# Patient Record
Sex: Female | Born: 1970 | Race: White | Hispanic: Yes | Marital: Single | Smoking: Never smoker
Health system: Southern US, Community
[De-identification: ages and names within clinical notes are randomized; demographics above are authoritative.]

## PROBLEM LIST (undated history)

## (undated) DIAGNOSIS — K8020 Calculus of gallbladder without cholecystitis without obstruction: Secondary | ICD-10-CM

## (undated) DIAGNOSIS — K219 Gastro-esophageal reflux disease without esophagitis: Secondary | ICD-10-CM

## (undated) DIAGNOSIS — K760 Fatty (change of) liver, not elsewhere classified: Secondary | ICD-10-CM

## (undated) DIAGNOSIS — M1990 Unspecified osteoarthritis, unspecified site: Secondary | ICD-10-CM

## (undated) DIAGNOSIS — I10 Essential (primary) hypertension: Secondary | ICD-10-CM

## (undated) HISTORY — PX: ABDOMINAL HYSTERECTOMY: SHX81

## (undated) HISTORY — DX: Calculus of gallbladder without cholecystitis without obstruction: K80.20

## (undated) HISTORY — PX: TUBAL LIGATION: SHX77

## (undated) HISTORY — DX: Fatty (change of) liver, not elsewhere classified: K76.0

---

## 2001-10-26 ENCOUNTER — Other Ambulatory Visit: Admission: RE | Admit: 2001-10-26 | Discharge: 2001-10-26 | Payer: Self-pay | Admitting: *Deleted

## 2002-03-31 ENCOUNTER — Inpatient Hospital Stay (HOSPITAL_COMMUNITY): Admission: AD | Admit: 2002-03-31 | Discharge: 2002-03-31 | Payer: Self-pay | Admitting: Obstetrics and Gynecology

## 2002-03-31 ENCOUNTER — Encounter: Payer: Self-pay | Admitting: Obstetrics and Gynecology

## 2002-04-03 ENCOUNTER — Inpatient Hospital Stay (HOSPITAL_COMMUNITY): Admission: AD | Admit: 2002-04-03 | Discharge: 2002-04-03 | Payer: Self-pay | Admitting: Obstetrics and Gynecology

## 2002-04-03 ENCOUNTER — Encounter: Payer: Self-pay | Admitting: Obstetrics and Gynecology

## 2002-04-15 ENCOUNTER — Inpatient Hospital Stay (HOSPITAL_COMMUNITY): Admission: AD | Admit: 2002-04-15 | Discharge: 2002-04-17 | Payer: Self-pay | Admitting: *Deleted

## 2002-04-26 ENCOUNTER — Inpatient Hospital Stay (HOSPITAL_COMMUNITY): Admission: AD | Admit: 2002-04-26 | Discharge: 2002-04-26 | Payer: Self-pay | Admitting: Obstetrics and Gynecology

## 2003-12-28 ENCOUNTER — Other Ambulatory Visit: Admission: RE | Admit: 2003-12-28 | Discharge: 2003-12-28 | Payer: Self-pay | Admitting: Gynecology

## 2004-12-29 ENCOUNTER — Other Ambulatory Visit: Admission: RE | Admit: 2004-12-29 | Discharge: 2004-12-29 | Payer: Self-pay | Admitting: Gynecology

## 2006-01-12 ENCOUNTER — Other Ambulatory Visit: Admission: RE | Admit: 2006-01-12 | Discharge: 2006-01-12 | Payer: Self-pay | Admitting: Gynecology

## 2006-03-10 ENCOUNTER — Ambulatory Visit (HOSPITAL_COMMUNITY): Admission: RE | Admit: 2006-03-10 | Discharge: 2006-03-10 | Payer: Self-pay | Admitting: Gynecology

## 2007-02-14 ENCOUNTER — Ambulatory Visit (HOSPITAL_COMMUNITY): Admission: RE | Admit: 2007-02-14 | Discharge: 2007-02-14 | Payer: Self-pay | Admitting: Chiropractic Medicine

## 2007-07-29 ENCOUNTER — Other Ambulatory Visit: Admission: RE | Admit: 2007-07-29 | Discharge: 2007-07-29 | Payer: Self-pay | Admitting: Gynecology

## 2010-11-09 ENCOUNTER — Encounter: Payer: Self-pay | Admitting: Gynecology

## 2011-07-17 ENCOUNTER — Ambulatory Visit
Admission: RE | Admit: 2011-07-17 | Discharge: 2011-07-17 | Disposition: A | Discharge status: Home / Self Care | Payer: BC Managed Care – PPO | Source: Ambulatory Visit | Attending: Specialist | Admitting: Specialist

## 2011-07-17 ENCOUNTER — Other Ambulatory Visit: Payer: Self-pay | Admitting: Specialist

## 2011-07-17 DIAGNOSIS — M542 Cervicalgia: Secondary | ICD-10-CM

## 2011-07-17 DIAGNOSIS — M25511 Pain in right shoulder: Secondary | ICD-10-CM

## 2011-07-17 DIAGNOSIS — R200 Anesthesia of skin: Secondary | ICD-10-CM

## 2012-03-07 ENCOUNTER — Other Ambulatory Visit: Payer: Self-pay | Admitting: Radiology

## 2013-11-04 ENCOUNTER — Ambulatory Visit (INDEPENDENT_AMBULATORY_CARE_PROVIDER_SITE_OTHER): Payer: BC Managed Care – PPO | Admitting: Emergency Medicine

## 2013-11-04 DIAGNOSIS — A088 Other specified intestinal infections: Secondary | ICD-10-CM

## 2013-11-04 MED ORDER — LOPERAMIDE HCL 2 MG PO TABS: ORAL_TABLET | ORAL | Status: DC

## 2013-11-04 MED ORDER — ONDANSETRON 8 MG PO TBDP: 8.0000 mg | ORAL_TABLET | Freq: Three times a day (TID) | ORAL | Status: DC | PRN

## 2013-11-04 MED ORDER — ONDANSETRON 4 MG PO TBDP
4.0000 mg | ORAL_TABLET | Freq: Once | ORAL | Status: AC
  Administered 2013-11-04: 4 mg via ORAL

## 2013-11-04 NOTE — Patient Instructions (Signed)
Gastroenteritis viral °(Viral Gastroenteritis) °La gastroenteritis viral también es conocida como gripe del estómago. Este trastorno afecta el estómago y el tubo digestivo. Puede causar diarrea y vómitos repentinos. La enfermedad generalmente dura entre 3 y 8 días. La mayoría de las personas desarrolla una respuesta inmunológica. Con el tiempo, esto elimina el virus. Mientras se desarrolla esta respuesta natural, el virus puede afectar en forma importante su salud.  °CAUSAS °Muchos virus diferentes pueden causar gastroenteritis, por ejemplo el rotavirus o el norovirus. Estos virus pueden contagiarse al consumir alimentos o agua contaminados. También puede contagiarse al compartir utensilios u otros artículos personales con una persona infectada o al tocar una superficie contaminada.  °SÍNTOMAS °Los síntomas más comunes son diarrea y vómitos. Estos problemas pueden causar una pérdida grave de líquidos corporales(deshidratación) y un desequilibrio de sales corporales(electrolitos). Otros síntomas pueden ser:  °· Fiebre. °· Dolor de cabeza. °· Fatiga. °· Dolor abdominal. °DIAGNÓSTICO  °El médico podrá hacer el diagnóstico de gastroenteritis viral basándose en los síntomas y el examen físico También pueden tomarle una muestra de materia fecal para diagnosticar la presencia de virus u otras infecciones.  °TRATAMIENTO °Esta enfermedad generalmente desaparece sin tratamiento. Los tratamientos están dirigidos a la rehidratación. Los casos más graves de gastroenteritis viral implican vómitos tan intensos que no es posible retener líquidos. En estos casos, los líquidos deben administrarse a través de una vía intravenosa (IV).  °INSTRUCCIONES PARA EL CUIDADO DOMICILIARIO °· Beba suficientes líquidos para mantener la orina clara o de color amarillo pálido. Beba pequeñas cantidades de líquido con frecuencia y aumente la cantidad según la tolerancia. °· Pida instrucciones específicas a su médico con respecto a la  rehidratación. °· Evite: °¨ Alimentos que tengan mucha azúcar. °¨ Alcohol. °¨ Gaseosas. °¨ Tabaco. °¨ Jugos. °¨ Bebidas con cafeína. °¨ Líquidos muy calientes o fríos. °¨ Alimentos muy grasos. °¨ Comer demasiado a la vez. °¨ Productos lácteos hasta 24 a 48 horas después de que se detenga la diarrea. °· Puede consumir probióticos. Los probióticos son cultivos activos de bacterias beneficiosas. Pueden disminuir la cantidad y el número de deposiciones diarreicas en el adulto. Se encuentran en los yogures con cultivos activos y en los suplementos. °· Lave bien sus manos para evitar que se disemine el virus. °· Sólo tome medicamentos de venta libre o recetados para calmar el dolor, las molestias o bajar la fiebre según las indicaciones de su médico. No administre aspirina a los niños. Los medicamentos antidiarreicos no son recomendables. °· Consulte a su médico si puede seguir tomando sus medicamentos recetados o de venta libre. °· Cumpla con todas las visitas de control, según le indique su médico. °SOLICITE ATENCIÓN MÉDICA DE INMEDIATO SI: °· No puede retener líquidos. °· No hay emisión de orina durante 6 a 8 horas. °· Le falta el aire. °· Observa sangre en el vómito (se ve como café molido) o en la materia fecal. °· Siente dolor abdominal que empeora o se concentra en una zona pequeña (se localiza). °· Tiene náuseas o vómitos persistentes. °· Tiene fiebre. °· El paciente es un niño menor de 3 meses y tiene fiebre. °· El paciente es un niño mayor de 3 meses, tiene fiebre y síntomas persistentes. °· El paciente es un niño mayor de 3 meses y tiene fiebre y síntomas que empeoran repentinamente. °· El paciente es un bebé y no tiene lágrimas cuando llora. °ASEGÚRESE QUE:  °· Comprende estas instrucciones. °· Controlará su enfermedad. °· Solicitará ayuda inmediatamente si no mejora o si empeora. °Document Released: 10/05/2005   Document Revised: 12/28/2011 [facility] Patient Information 2014 [place], [place]. Dieta de lquidos  claros (Clear Liquid Diet) La dieta de lquidos claros consiste en alimentos lquidos o que se transforman en lquidos a la Engineer, water. Algunos ejemplos de lquidos claros que se incluyen en la dieta son los jugos de frutas, caldo o consom, gelatina o helados de [place]. Debe poder ver a travs del lquido. El propsito de seguir esta dieta es proporcionar lquidos, electrolitos como sodio y potasio y la energa para Contractor organismo en funcionamiento durante los momentos en que no puede seguir una dieta normal. Una dieta de lquidos claros no debe continuarse durante largos perodos ya que no es adecuada desde el punto de vista nutricional.  UNA DIETA DE LQUIDOS CLAROS PUEDE SER NECESARIA:  Cuando aparece una enfermedad repentina (aguda) antes o despus de [place].   Como Software engineer paso de la alimentacin por va oral.   Para la reposicin de lquidos y electrolitos en la diarrea.   Se puede utilizar como una dieta antes de Optometrist ciertos estudios.  REQUERIMIENTOS La dieta de lquidos claros cumple los requerimientos slo para el cido ascrbico, segn los Recommended Dietary Allowances (cantidades recomendadas en la dieta) [place] ([place]).  [place] y harinas  Permitidos: Ninguno   Evitar: Todos deben evitarse.  Verduras  Permitidas: Jugos de vegetales colados.   Evite: Todas los dems.  Frutas  Permitidas: Los jugos de frutas colados y bebidas preparadas con fruta. Incluye 1 porcin de ctricos o jugos de frutas enriquecidos con [place].   Evite: Todas las dems  Carnes y sustitutos  Permitidos: Ninguno.   Evitar: Todas deben evitarse.  Productos lcteos  Permitidos: Ninguno.   Evitar: Todos debe evitarse.  Sopas y alimentos combinados  Permitidos: Caldos claros o procesados, sopas a base de caldos.   Evite: [employer].  Postres y  dulces  Permitidos:  Location manager, miel. Gelatina rica en protenas. Gelatina saborizada, sorbetes o popsicles que no contengan leche   Evite: [employer].  Grasas y aceites  Permitidos: Ninguno.   Evitar: Todos deben evitarse.  Bebidas  Permitidas Bebidas elaboradas con cereal, caf (comn o descafeinado), t o soda, segn el criterio de su mdico.   Evite: Todas las dems.  Condimentos  Permitidos: Sal.   Evite: [employer], inclusive [place].  Suplementos  Permitidos: Bebidas lquidas nutritivas por las que se puede ver a travs de ellas.   Evite:  Cualquiera que contenga lactosa o [place]. EJEMPLO DE PLAN DE ALIMENTACIN Desayuno  4 onzas (120 ml) de jugo de naranjas exprimidas.   a 1 taza (120 a 240 ml) de gelatina (comn o fortificada).  1 taza (240 ml) de infusin (caf o te).  Azcar, si lo desea. Colacin a media maana   taza (120 ml) de gelatina (comn o fortificada). Almuerzo:  1 taza (240 ml) de caldo o consom.  4 onzas (120 ml) de jugo de pomelos exprimidos.   taza (120 ml) de gelatina (comn o fortificada).  1 taza (240 ml) de infusin (caf o te).  Azcar, si lo desea. Colacin de media tarde   taza (120 ml) de helado de frutas.   taza (120 ml) de jugo de frutas exprimidas. Cena.  1 taza (240 ml) de caldo o consom.   taza (120 ml) de jugo de arndanos.   taza (120 ml) de gelatina saborizada (comn o fortificada).  1 taza (240 ml)  de infusin (caf o t).  Azcar, si lo desea. Colacin de la noche.  4 onzas (120 ml) de jugo de manzanas (fortificado con vitamina C).   taza (120 ml) de gelatina saborizada (comn o fortificada). ASEGRESE DE QUE:  Comprende estas instrucciones.  Controlar la enfermedad del nio.  Solicitar ayuda de inmediato si el nio no mejora o si empeora. Document Released: 10/05/2005 Document Revised: 06/07/2013 [facility] Patient Information 2014 [place], [place].

## 2013-11-04 NOTE — Progress Notes (Signed)
[facility] [address], [place] [address] [phone]  Date:  11/04/2013   Name:  Brittany Romero   DOB:  [birth year]   MRN:  [id number]  PCP:  No primary provider on file.    Chief Complaint: Shoulder Pain, Chest Pain, Emesis and Diarrhea   History of Present Illness:  Brittany Romero is a 43 y.o. very pleasant female patient who presents with the following:  Ill three days with nausea and vomiting.  Later developed diarrhea 8-10 times daily watery in nature.  No fever but chilled.  No rash, cough or coryza.  No travel or sketchy water.  Still nauseated and vomited today.  Poor po intake.  No improvement with over the counter medications or other home remedies. Denies other complaint or health concern today.   There are no active problems to display for this patient.   No past medical history on file.  No past surgical history on file.  History  Substance Use Topics  . Smoking status: Not on file  . Smokeless tobacco: Not on file  . Alcohol Use: No    No family history on file.  No Known Allergies  Medication list has been reviewed and updated.  No current outpatient prescriptions on file prior to visit.   No current facility-administered medications on file prior to visit.    Review of Systems:  As per HPI, otherwise negative.    Physical Examination: There were no vitals filed for this visit. There were no vitals filed for this visit. There is no height or weight on file to calculate BMI. Ideal Body Weight:    GEN: WDWN, NAD, Non-toxic, A & O x 3 HEENT: Atraumatic, Normocephalic. Neck supple. No masses, No LAD. Ears and Nose: No external deformity. CV: RRR, No M/G/R. No JVD. No thrill. No extra heart sounds. PULM: CTA B, no wheezes, crackles, rhonchi. No retractions. No resp. distress. No accessory muscle use. ABD: S, NT, ND, +BS. No rebound. No HSM. EXTR: No c/c/e NEURO Normal gait.  PSYCH: Normally interactive. Conversant.  Not depressed or anxious appearing.  Calm demeanor.    Assessment and Plan: Gastroenteritis zofran Imodium Clears   Signed,  Ellison Carwin, MD

## 2014-01-06 ENCOUNTER — Ambulatory Visit (INDEPENDENT_AMBULATORY_CARE_PROVIDER_SITE_OTHER): Payer: BC Managed Care – PPO | Admitting: Family Medicine

## 2014-01-06 VITALS — BP 102/72 | HR 82 | Temp 98.2°F | Resp 16 | Ht 64.0 in | Wt 161.8 lb

## 2014-01-06 DIAGNOSIS — I10 Essential (primary) hypertension: Secondary | ICD-10-CM

## 2014-01-06 DIAGNOSIS — M791 Myalgia, unspecified site: Secondary | ICD-10-CM

## 2014-01-06 DIAGNOSIS — IMO0001 Reserved for inherently not codable concepts without codable children: Secondary | ICD-10-CM

## 2014-01-06 MED ORDER — LISINOPRIL-HYDROCHLOROTHIAZIDE 10-12.5 MG PO TABS: 1.0000 | ORAL_TABLET | Freq: Every day | ORAL | Status: DC

## 2014-01-06 MED ORDER — PREDNISONE 20 MG PO TABS: 40.0000 mg | ORAL_TABLET | Freq: Every day | ORAL | Status: DC

## 2014-01-06 NOTE — Patient Instructions (Signed)
Brittany Romero

## 2014-01-06 NOTE — Progress Notes (Signed)
This chart was scribed for Robyn Haber, MD by Vernell Barrier, Medical Scribe. This patient's care was started at 3:45 PM  Patient ID: Brittany Romero MRN: [id number], DOB: [birth year], 43 y.o. Date of Encounter: 01/06/2014, 3:44 PM  Primary Physician: No primary provider on file.  Chief Complaint: bilateral shoulder pain  HPI: 43 y.o. year old female with history below presents with bilateral shoulder pain, onset 1 month ago. Pt states she cannot lift her arms without pain and it hurts to hyperextend her neck. Has also had 3 episodes of left sided chest pain that usually follow after eating. Pt has already been to a physical therapist with no relief of pain. Pt works in a [employer] that involves a lot of lifting; has been working there for 12 years.    History reviewed. No pertinent past medical history.   Home Meds: Prior to Admission medications   Medication Sig Start Date End Date Taking? Authorizing Provider  loperamide (IMODIUM A-D) 2 MG tablet 2 now and one after each loose stool as often as q1h.  Max 8 in 24 h 11/04/13   Ellison Carwin, MD  ondansetron (ZOFRAN-ODT) 8 MG disintegrating tablet Take 1 tablet (8 mg total) by mouth every 8 (eight) hours as needed for nausea. 11/04/13   Ellison Carwin, MD    Allergies: No Known Allergies  History   Social History   Marital Status: Single    Spouse Name: N/A    Number of Children: N/A   Years of Education: N/A   Occupational History   Not on file.   Social History Main Topics   Smoking status: Not on file   Smokeless tobacco: Not on file   Alcohol Use: No   Drug Use: No   Sexual Activity: Not on file   Other Topics Concern   Not on file   Social History Narrative   No narrative on file     Review of Systems: Constitutional: negative for chills, fever, night sweats, weight changes, or fatigue  HEENT: negative for vision changes, hearing loss, congestion, rhinorrhea, ST, epistaxis, or sinus  pressure Cardiovascular: negative for palpitations. Positive for chest pain. Respiratory: negative for hemoptysis, wheezing, shortness of breath, or cough Abdominal: negative for abdominal pain, nausea, vomiting, diarrhea, or constipation Dermatological: negative for rash Neurologic: negative for headache, dizziness, or syncope Musc: Positive for shoulder pain All other systems reviewed and are otherwise negative with the exception to those above and in the HPI.   Physical Exam: Blood pressure 102/72, pulse 82, temperature 98.2 F (36.8 C), temperature source Oral, resp. rate 16, height 5\' 4"  (1.626 m), weight 161 lb 12.8 oz (73.392 kg), last menstrual period 06/23/2011, SpO2 99.00%., Body mass index is 27.76 kg/(m^2). General: Well developed, well nourished, in no acute distress. Head: Normocephalic, atraumatic, eyes without discharge, sclera non-icteric, nares are without discharge. Bilateral auditory canals clear, TM's are without perforation, pearly grey and translucent with reflective cone of light bilaterally. Oral cavity moist, posterior pharynx without exudate, erythema, peritonsillar abscess, or post nasal drip.  Neck: Supple. No thyromegaly. Full ROM. No lymphadenopathy. Lungs: Clear bilaterally to auscultation without wheezes, rales, or rhonchi. Breathing is unlabored. Heart: RRR with S1 S2. No murmurs, rubs, or gallops appreciated. Abdomen: Soft, non-tender, non-distended with normoactive bowel sounds. No hepatomegaly. No rebound/guarding. No obvious abdominal masses. Msk:  Strength and tone normal for age. Extremities/Skin: Warm and dry. No clubbing or cyanosis. No edema. No rashes or suspicious lesions. Neuro: Alert and oriented X 3. Moves  all extremities spontaneously. Gait is normal. CNII-XII grossly in tact. Psych:  Responds to questions appropriately with a normal affect.     ASSESSMENT AND PLAN:  43 y.o. year old female with Myalgia - Plan: predniSONE (DELTASONE) 20 MG  tablet, Comprehensive metabolic panel  Hypertension - Plan: lisinopril-hydrochlorothiazide (PRINZIDE,ZESTORETIC) 10-12.5 MG per tablet, Comprehensive metabolic panel  Recheck in 3 days   Signed, Robyn Haber, MD 01/06/2014 3:44 PM

## 2014-01-07 LAB — COMPREHENSIVE METABOLIC PANEL
ALT: 25 U/L (ref 0–35)
AST: 17 U/L (ref 0–37)
Albumin: 4.6 g/dL (ref 3.5–5.2)
Alkaline Phosphatase: 83 U/L (ref 39–117)
BUN: 16 mg/dL (ref 6–23)
CO2: 27 mEq/L (ref 19–32)
Calcium: 9.7 mg/dL (ref 8.4–10.5)
Chloride: 100 mEq/L (ref 96–112)
Creat: 0.49 mg/dL — ABNORMAL LOW (ref 0.50–1.10)
Glucose, Bld: 114 mg/dL — ABNORMAL HIGH (ref 70–99)
Potassium: 3.7 mEq/L (ref 3.5–5.3)
Sodium: 137 mEq/L (ref 135–145)
Total Bilirubin: 0.4 mg/dL (ref 0.2–1.2)
Total Protein: 7.7 g/dL (ref 6.0–8.3)

## 2014-01-09 ENCOUNTER — Ambulatory Visit: Payer: BC Managed Care – PPO

## 2014-01-09 ENCOUNTER — Ambulatory Visit (INDEPENDENT_AMBULATORY_CARE_PROVIDER_SITE_OTHER): Payer: BC Managed Care – PPO | Admitting: Family Medicine

## 2014-01-09 VITALS — BP 126/82 | HR 98 | Temp 98.2°F | Resp 17 | Ht 63.5 in | Wt 160.0 lb

## 2014-01-09 DIAGNOSIS — M542 Cervicalgia: Secondary | ICD-10-CM

## 2014-01-09 MED ORDER — METHYLPREDNISOLONE 4 MG PO TABS: ORAL_TABLET | ORAL | Status: DC

## 2014-01-09 NOTE — Progress Notes (Signed)
° °  Subjective:  This chart was scribed for Robyn Haber, MD by Donato Schultz, Medical Scribe. This patient was seen in Room 11 and the patient's care was started at 6:37 PM.   Patient ID: Brittany Romero, female    DOB: 19-Mar-1971, 43 y.o.   MRN: [id number]  HPI HPI Comments: Brittany Romero is a 43 y.o. female who presents to the [facility] complaining of constant neck pain radiating to her right shoulder and chest.  The patient states that the pain in her right shoulder and chest have resolved but she is still experiencing neck pain and headache.  She states that she has been going to work and can lift heavy things while at work.     No past medical history on file. Past Surgical History  Procedure Laterality Date   Abdominal hysterectomy     No family history on file. History   Social History   Marital Status: Single    Spouse Name: N/A    Number of Children: N/A   Years of Education: N/A   Occupational History   Not on file.   Social History Main Topics   Smoking status: Never Smoker    Smokeless tobacco: Not on file   Alcohol Use: No   Drug Use: No   Sexual Activity: Not on file   Other Topics Concern   Not on file   Social History Narrative   No narrative on file   No Known Allergies  Review of Systems  Musculoskeletal: Positive for neck pain. Negative for arthralgias.  Neurological: Positive for headaches.  All other systems reviewed and are negative.     Objective:  Physical Exam Skin: Ecchymosis where her straps go over her shoulder.   Neurological: Normal bicep and tricep reflexes. [facility] reading (PRIMARY) by  Dr. Joseph Art:  C/spine:  Normal AP and lateral.    BP 126/82   Pulse 98   Temp(Src) 98.2 F (36.8 C) (Oral)   Resp 17   Ht 5' 3.5" (1.613 m)   Wt 160 lb (72.576 kg)   BMI 27.89 kg/m2   SpO2 98%   LMP 06/23/2011 Assessment & Plan:  I personally performed the services described in this documentation, which was  scribed in my presence. The recorded information has been reviewed and is accurate.  Neck pain - Plan: DG Cervical Spine 2 or 3 views, methylPREDNISolone (MEDROL) 4 MG tablet  Signed, Robyn Haber, MD

## 2014-01-15 ENCOUNTER — Ambulatory Visit: Payer: BC Managed Care – PPO

## 2014-01-15 ENCOUNTER — Ambulatory Visit (INDEPENDENT_AMBULATORY_CARE_PROVIDER_SITE_OTHER): Payer: BC Managed Care – PPO | Admitting: Family Medicine

## 2014-01-15 VITALS — BP 114/78 | HR 77 | Temp 98.1°F | Resp 16 | Ht 63.5 in | Wt 158.0 lb

## 2014-01-15 DIAGNOSIS — M542 Cervicalgia: Secondary | ICD-10-CM

## 2014-01-15 DIAGNOSIS — M25512 Pain in left shoulder: Secondary | ICD-10-CM

## 2014-01-15 DIAGNOSIS — R42 Dizziness and giddiness: Secondary | ICD-10-CM

## 2014-01-15 DIAGNOSIS — M25519 Pain in unspecified shoulder: Secondary | ICD-10-CM

## 2014-01-15 LAB — POCT CBC
Granulocyte percent: 54.6 %G (ref 37–80)
HCT, POC: 46.4 % (ref 37.7–47.9)
Hemoglobin: 15.1 g/dL (ref 12.2–16.2)
Lymph, poc: 4.1 — AB (ref 0.6–3.4)
MCH, POC: 29.2 pg (ref 27–31.2)
MCHC: 32.5 g/dL (ref 31.8–35.4)
MCV: 89.5 fL (ref 80–97)
MID (cbc): 1 — AB (ref 0–0.9)
MPV: 9.4 fL (ref 0–99.8)
POC Granulocyte: 6.2 (ref 2–6.9)
POC LYMPH PERCENT: 36.3 %L (ref 10–50)
POC MID %: 9.1 %M (ref 0–12)
Platelet Count, POC: 267 10*3/uL (ref 142–424)
RBC: 5.18 M/uL (ref 4.04–5.48)
RDW, POC: 14.4 %
WBC: 11.3 10*3/uL — AB (ref 4.6–10.2)

## 2014-01-15 LAB — GLUCOSE, POCT (MANUAL RESULT ENTRY): POC Glucose: 67 mg/dl — AB (ref 70–99)

## 2014-01-15 NOTE — Progress Notes (Signed)
HPI: 43 y.o. year old female with history below presents with bilateral shoulder pain, onset 1 month ago. Pt states she cannot lift her arms without pain and it hurts to hyperextend her neck. Has also had 3 episodes of left sided chest pain that usually follow after eating. Pt has already been to a physical therapist with no relief of pain. Pt works in a [employer] that involves a lot of lifting; has been working there for 12 years.   She's been able to continue work although she does not have to lift heavy items at this point.  She says that she does have some pain in her left neck as well as over the scapular region and where the bra strap courses over the shoulder, as well as over the lateral aspect of the shoulder.  In addition she's been dizzy lately with lightheadedness when she gets up quickly. She's worried about having diabetes or cholesterol problems.  Objective:  NAD  [facility] reading (PRIMARY) by  Dr. Joseph Art negative shoulder film  Results for orders placed in visit on 01/15/14  POCT CBC      Result Value Ref Range   WBC 11.3 (*) 4.6 - 10.2 K/uL   Lymph, poc 4.1 (*) 0.6 - 3.4   POC LYMPH PERCENT 36.3  10 - 50 %L   MID (cbc) 1.0 (*) 0 - 0.9   POC MID % 9.1  0 - 12 %M   POC Granulocyte 6.2  2 - 6.9   Granulocyte percent 54.6  37 - 80 %G   RBC 5.18  4.04 - 5.48 M/uL   Hemoglobin 15.1  12.2 - 16.2 g/dL   HCT, POC 46.4  37.7 - 47.9 %   MCV 89.5  80 - 97 fL   MCH, POC 29.2  27 - 31.2 pg   MCHC 32.5  31.8 - 35.4 g/dL   RDW, POC 14.4     Platelet Count, POC 267  142 - 424 K/uL   MPV 9.4  0 - 99.8 fL  GLUCOSE, POCT (MANUAL RESULT ENTRY)      Result Value Ref Range   POC Glucose 67 (*) 70 - 99 mg/dl   Examination of the left shoulder reveals diffuse mild tenderness on palpating the anterior and superior joint line of the left shoulder as well as some tenderness over the deltoid region and in the left lower cervical paraspinal region. There is no swelling or bony abnormality,  patient has full range of motion..  Assessment: Persistent strain of the left shoulder and neck. I think the best course of action at this point is to get a physical therapist involved and have patient come back in 2 weeks.  .Left shoulder pain - Plan: DG Shoulder Left  Dizziness - Plan: POCT CBC, POCT glucose (manual entry), Lipid panel, Comprehensive metabolic panel    Signed, Stephannie Li M.D.

## 2014-01-16 LAB — LIPID PANEL
Cholesterol: 205 mg/dL — ABNORMAL HIGH (ref 0–200)
HDL: 54 mg/dL (ref >39)
LDL Cholesterol: 109 mg/dL — ABNORMAL HIGH (ref 0–99)
Total CHOL/HDL Ratio: 3.8 Ratio
Triglycerides: 209 mg/dL — ABNORMAL HIGH (ref <150)
VLDL: 42 mg/dL — ABNORMAL HIGH (ref 0–40)

## 2014-01-16 LAB — COMPREHENSIVE METABOLIC PANEL
ALT: 52 U/L — ABNORMAL HIGH (ref 0–35)
AST: 15 U/L (ref 0–37)
Albumin: 4.8 g/dL (ref 3.5–5.2)
Alkaline Phosphatase: 92 U/L (ref 39–117)
BUN: 12 mg/dL (ref 6–23)
CO2: 30 mEq/L (ref 19–32)
Calcium: 9.7 mg/dL (ref 8.4–10.5)
Chloride: 97 mEq/L (ref 96–112)
Creat: 0.51 mg/dL (ref 0.50–1.10)
Glucose, Bld: 66 mg/dL — ABNORMAL LOW (ref 70–99)
Potassium: 3.6 mEq/L (ref 3.5–5.3)
Sodium: 138 mEq/L (ref 135–145)
Total Bilirubin: 0.6 mg/dL (ref 0.2–1.2)
Total Protein: 8.1 g/dL (ref 6.0–8.3)

## 2014-10-24 ENCOUNTER — Emergency Department (HOSPITAL_COMMUNITY): Payer: BLUE CROSS/BLUE SHIELD

## 2014-10-24 ENCOUNTER — Encounter (HOSPITAL_COMMUNITY): Payer: Self-pay | Admitting: Adult Health

## 2014-10-24 ENCOUNTER — Emergency Department (HOSPITAL_COMMUNITY)
Admission: EM | Admit: 2014-10-24 | Discharge: 2014-10-24 | Disposition: A | Discharge status: Home / Self Care | Payer: BLUE CROSS/BLUE SHIELD | Attending: Emergency Medicine | Admitting: Emergency Medicine

## 2014-10-24 DIAGNOSIS — M542 Cervicalgia: Secondary | ICD-10-CM | POA: Diagnosis not present

## 2014-10-24 DIAGNOSIS — M79641 Pain in right hand: Secondary | ICD-10-CM | POA: Diagnosis not present

## 2014-10-24 DIAGNOSIS — G43109 Migraine with aura, not intractable, without status migrainosus: Secondary | ICD-10-CM | POA: Insufficient documentation

## 2014-10-24 DIAGNOSIS — M79643 Pain in unspecified hand: Secondary | ICD-10-CM

## 2014-10-24 DIAGNOSIS — R51 Headache: Secondary | ICD-10-CM

## 2014-10-24 DIAGNOSIS — R519 Headache, unspecified: Secondary | ICD-10-CM

## 2014-10-24 MED ORDER — SODIUM CHLORIDE 0.9 % IV BOLUS (SEPSIS)
1000.0000 mL | Freq: Once | INTRAVENOUS | Status: AC
  Administered 2014-10-24: 1000 mL via INTRAVENOUS

## 2014-10-24 MED ORDER — DIPHENHYDRAMINE HCL 50 MG/ML IJ SOLN
25.0000 mg | Freq: Once | INTRAMUSCULAR | Status: AC
  Administered 2014-10-24: 25 mg via INTRAVENOUS
  Filled 2014-10-24: qty 1

## 2014-10-24 MED ORDER — KETOROLAC TROMETHAMINE 30 MG/ML IJ SOLN
30.0000 mg | Freq: Once | INTRAMUSCULAR | Status: AC
  Administered 2014-10-24: 30 mg via INTRAVENOUS
  Filled 2014-10-24: qty 1

## 2014-10-24 MED ORDER — PROCHLORPERAZINE EDISYLATE 5 MG/ML IJ SOLN
10.0000 mg | Freq: Four times a day (QID) | INTRAMUSCULAR | Status: DC | PRN
  Administered 2014-10-24: 10 mg via INTRAVENOUS
  Filled 2014-10-24: qty 2

## 2014-10-24 NOTE — ED Provider Notes (Signed)
CSN: [id number]     Arrival date & time 10/24/14  1605 History   None    Chief Complaint  Patient presents with  . Headache     (Consider location/radiation/quality/duration/timing/severity/associated sxs/prior Treatment) Patient is a 44 y.o. female presenting with headaches.  Headache Pain location:  Generalized Radiates to:  L neck Onset quality:  Gradual Duration:  4 hours Timing:  Intermittent Progression:  Waxing and waning Chronicity:  Recurrent Similar to prior headaches: yes   Context: bright light   Worsened by:  Light Associated symptoms: dizziness, nausea, neck pain, numbness (tingling/altered sensation right arm and leg), paresthesias, tingling and vomiting   Associated symptoms: no abdominal pain, no back pain, no cough, no diarrhea, no fever, no focal weakness, no sore throat, no syncope, no visual change and no weakness     History reviewed. No pertinent past medical history. Past Surgical History  Procedure Laterality Date  . Abdominal hysterectomy     History reviewed. No pertinent family history. History  Substance Use Topics  . Smoking status: Never Smoker   . Smokeless tobacco: Not on file  . Alcohol Use: No   OB History    No data available     Review of Systems  Constitutional: Negative for fever.  HENT: Negative for sore throat.   Eyes: Negative for visual disturbance.  Respiratory: Negative for cough and shortness of breath.   Cardiovascular: Negative for chest pain and syncope.  Gastrointestinal: Positive for nausea and vomiting. Negative for abdominal pain, diarrhea and constipation.  Genitourinary: Negative for difficulty urinating.  Musculoskeletal: Positive for neck pain. Negative for back pain.  Skin: Negative for rash.  Neurological: Positive for dizziness, numbness (tingling/altered sensation right arm and leg), headaches and paresthesias. Negative for focal weakness, syncope, facial asymmetry, speech difficulty and weakness.       Allergies  Review of patient's allergies indicates no known allergies.  Home Medications   Prior to Admission medications   Medication Sig Start Date End Date Taking? Authorizing Provider  lisinopril-hydrochlorothiazide (PRINZIDE,ZESTORETIC) 10-12.5 MG per tablet Take 1 tablet by mouth daily. 01/06/14   Robyn Haber, MD  loperamide (IMODIUM A-D) 2 MG tablet 2 now and one after each loose stool as often as q1h.  Max 8 in 24 h 11/04/13   Roselee Culver, MD  methylPREDNISolone (MEDROL) 4 MG tablet 2 tablets al dia 01/09/14   Robyn Haber, MD  ondansetron (ZOFRAN-ODT) 8 MG disintegrating tablet Take 1 tablet (8 mg total) by mouth every 8 (eight) hours as needed for nausea. 11/04/13   Roselee Culver, MD  predniSONE (DELTASONE) 20 MG tablet Take 2 tablets (40 mg total) by mouth daily. 01/06/14   Robyn Haber, MD   BP 136/90 mmHg  Pulse 98  Temp(Src) 98.2 F (36.8 C) (Oral)  Resp 15  SpO2 100%  LMP 06/23/2011 Physical Exam  Constitutional: She is oriented to person, place, and time. She appears well-developed and well-nourished. No distress.  HENT:  Head: Normocephalic and atraumatic.  Eyes: Conjunctivae and EOM are normal.  Neck: Normal range of motion.  Cardiovascular: Normal rate, regular rhythm, normal heart sounds and intact distal pulses.  Exam reveals no gallop and no friction rub.   No murmur heard. Pulmonary/Chest: Effort normal and breath sounds normal. No respiratory distress. She has no wheezes. She has no rales.  Abdominal: Soft. She exhibits no distension. There is no tenderness. There is no guarding.  Musculoskeletal: She exhibits no edema.       Cervical back:  She exhibits tenderness (bilateral traps).       Right hand: She exhibits tenderness (area of swelling over dorsum of right hand, no erytherma). She exhibits normal capillary refill. Normal sensation noted. Normal strength noted.  Neurological: She is alert and oriented to person, place, and time.  She has normal strength. No cranial nerve deficit. Sensory deficit: initially altered sensation on right, on repeat exam no altered sensation. Coordination and gait normal. GCS eye subscore is 4. GCS verbal subscore is 5. GCS motor subscore is 6.  Skin: Skin is warm and dry. No rash noted. She is not diaphoretic. No erythema.  Nursing note and vitals reviewed.   ED Course  Procedures (including critical care time) Labs Review Labs Reviewed - No data to display  Imaging Review No results found.   EKG Interpretation None      MDM   Final diagnoses:  None   44 year old Spanish-speaking female with history of neck pain presents with concern of headache and right sided numbness.  She has has a history of headaches and reports one similar headache in the past. Headache had a slow onset and have low suspicion for subarachnoid hemorrhage.  No fever to indicate meningitis. She does not have any stroke risk factors and other than altered sensation on the right her neurologic exam is normal.  Headache with several characteristics consistent with complicated migraine including photophobia/nausea.  CT Head within normal limits.  Patient given headache cocktail of compazine and benadryl with HA improved and neurologic symptoms resolved. Given additional toradol with HA nearly resolved. Given normal repeat neurologic exam, lack of stroke risk factors, low suspicion that HA and transient numbness represent ischemic stroke.  In addition, pt reports chronic episodes of neck pain, and notes area of swelling and pain over right hand.  Xr shows no sign of fracture.  Area not consistent with abscess or cellulitis.  Patient discharged in stable condition with understanding of reasons to return and will follow up closely with the PCP.     Alvino Chapel, MD 10/25/14 3435  Arbie Cookey, MD 10/27/14 [phone]

## 2014-10-24 NOTE — ED Notes (Signed)
Presents with gradual onset of headahce, became worse while at work this afternoon associated with nausea, pt is spanish speaking. Interpreter phones used. Warm to touch. This is the second headache like this she has had.  She reports blurred vision, dizziness and numbness to face and right arm weakness.

## 2014-10-25 NOTE — ED Provider Notes (Signed)
I saw and evaluated the patient, reviewed the resident's note and I agree with the findings and plan.   EKG Interpretation None       44 yo female presenting with chief complaint of headache with right-sided body tingling. She has had similar symptoms in the past. She denies right-sided weakness, but does have pain in her wrist.  On exam, well appearing, nontoxic, not distressed, normal respiratory effort, normal perfusion, grip strength is mildly decreased on the right secondary to pain in her wrist, otherwise treat him shows normal strength in all extremities, sensation normal. Imaging negative. Symptoms resolved after treatment of headache. Symptoms likely represent a complicated migraine. She has no stroke risk factors. Her symptoms were felt to be very unlikely represent CVA. After treatment, she appeared stable for discharge home.  Clinical Impression: 1. Complicated migraine   2. Headache   3. Hand pain       Houston Siren III, MD 10/27/14 [phone]

## 2016-02-15 DIAGNOSIS — M1990 Unspecified osteoarthritis, unspecified site: Secondary | ICD-10-CM | POA: Insufficient documentation

## 2016-02-15 DIAGNOSIS — E781 Pure hyperglyceridemia: Secondary | ICD-10-CM | POA: Insufficient documentation

## 2016-02-15 DIAGNOSIS — E785 Hyperlipidemia, unspecified: Secondary | ICD-10-CM | POA: Insufficient documentation

## 2016-02-15 DIAGNOSIS — I10 Essential (primary) hypertension: Secondary | ICD-10-CM | POA: Diagnosis present

## 2017-02-18 ENCOUNTER — Emergency Department (HOSPITAL_COMMUNITY): Payer: BLUE CROSS/BLUE SHIELD

## 2017-02-18 ENCOUNTER — Encounter (HOSPITAL_COMMUNITY): Payer: Self-pay

## 2017-02-18 ENCOUNTER — Observation Stay (HOSPITAL_COMMUNITY)
Admission: EM | Admit: 2017-02-18 | Discharge: 2017-02-19 | Disposition: A | Discharge status: Home / Self Care | Payer: BLUE CROSS/BLUE SHIELD | Attending: Internal Medicine | Admitting: Internal Medicine

## 2017-02-18 DIAGNOSIS — J101 Influenza due to other identified influenza virus with other respiratory manifestations: Secondary | ICD-10-CM | POA: Insufficient documentation

## 2017-02-18 DIAGNOSIS — I10 Essential (primary) hypertension: Secondary | ICD-10-CM | POA: Insufficient documentation

## 2017-02-18 DIAGNOSIS — K8020 Calculus of gallbladder without cholecystitis without obstruction: Principal | ICD-10-CM | POA: Insufficient documentation

## 2017-02-18 DIAGNOSIS — Z79899 Other long term (current) drug therapy: Secondary | ICD-10-CM | POA: Insufficient documentation

## 2017-02-18 DIAGNOSIS — R110 Nausea: Secondary | ICD-10-CM

## 2017-02-18 DIAGNOSIS — R1011 Right upper quadrant pain: Secondary | ICD-10-CM | POA: Diagnosis present

## 2017-02-18 DIAGNOSIS — R112 Nausea with vomiting, unspecified: Secondary | ICD-10-CM

## 2017-02-18 DIAGNOSIS — K219 Gastro-esophageal reflux disease without esophagitis: Secondary | ICD-10-CM | POA: Diagnosis present

## 2017-02-18 DIAGNOSIS — R059 Cough, unspecified: Secondary | ICD-10-CM | POA: Insufficient documentation

## 2017-02-18 HISTORY — DX: Essential (primary) hypertension: I10

## 2017-02-18 HISTORY — DX: Influenza due to other identified influenza virus with other respiratory manifestations: J10.1

## 2017-02-18 HISTORY — DX: Unspecified osteoarthritis, unspecified site: M19.90

## 2017-02-18 HISTORY — DX: Gastro-esophageal reflux disease without esophagitis: K21.9

## 2017-02-18 LAB — COMPREHENSIVE METABOLIC PANEL
ALK PHOS: 100 U/L (ref 38–126)
ALT: 28 U/L (ref 14–54)
AST: 27 U/L (ref 15–41)
Albumin: 4.6 g/dL (ref 3.5–5.0)
Anion gap: 10 (ref 5–15)
BUN: 11 mg/dL (ref 6–20)
CALCIUM: 9.1 mg/dL (ref 8.9–10.3)
CO2: 26 mmol/L (ref 22–32)
Chloride: 104 mmol/L (ref 101–111)
Creatinine, Ser: 0.55 mg/dL (ref 0.44–1.00)
GLUCOSE: 94 mg/dL (ref 65–99)
POTASSIUM: 3.5 mmol/L (ref 3.5–5.1)
Sodium: 140 mmol/L (ref 135–145)
TOTAL PROTEIN: 7.8 g/dL (ref 6.5–8.1)
Total Bilirubin: 0.5 mg/dL (ref 0.3–1.2)

## 2017-02-18 LAB — I-STAT CG4 LACTIC ACID, ED: LACTIC ACID, VENOUS: 0.58 mmol/L (ref 0.5–1.9)

## 2017-02-18 LAB — CBC
HCT: 39 % (ref 36.0–46.0)
Hemoglobin: 13.1 g/dL (ref 12.0–15.0)
MCH: 29 pg (ref 26.0–34.0)
MCHC: 33.6 g/dL (ref 30.0–36.0)
MCV: 86.5 fL (ref 78.0–100.0)
Platelets: 155 10*3/uL (ref 150–400)
RBC: 4.51 MIL/uL (ref 3.87–5.11)
RDW: 13.4 % (ref 11.5–15.5)
WBC: 7.2 10*3/uL (ref 4.0–10.5)

## 2017-02-18 LAB — URINALYSIS, ROUTINE W REFLEX MICROSCOPIC
Bilirubin Urine: NEGATIVE
Glucose, UA: NEGATIVE mg/dL
Hgb urine dipstick: NEGATIVE
KETONES UR: 20 mg/dL — AB
LEUKOCYTES UA: NEGATIVE
NITRITE: NEGATIVE
Protein, ur: NEGATIVE mg/dL
Specific Gravity, Urine: 1.011 (ref 1.005–1.030)
pH: 7 (ref 5.0–8.0)

## 2017-02-18 LAB — INFLUENZA PANEL BY PCR (TYPE A & B)
INFLBPCR: POSITIVE — AB
Influenza A By PCR: NEGATIVE

## 2017-02-18 LAB — I-STAT BETA HCG BLOOD, ED (MC, WL, AP ONLY): I-stat hCG, quantitative: <5 m[IU]/mL (ref <5)

## 2017-02-18 LAB — LIPASE, BLOOD: LIPASE: 28 U/L (ref 11–51)

## 2017-02-18 MED ORDER — ACETAMINOPHEN 325 MG PO TABS: 650.0000 mg | ORAL_TABLET | Freq: Four times a day (QID) | ORAL | Status: DC | PRN

## 2017-02-18 MED ORDER — SODIUM CHLORIDE 0.9 % IV BOLUS (SEPSIS)
1000.0000 mL | Freq: Once | INTRAVENOUS | Status: AC
  Administered 2017-02-18: 1000 mL via INTRAVENOUS

## 2017-02-18 MED ORDER — HYDROCODONE-ACETAMINOPHEN 5-325 MG PO TABS: 1.0000 | ORAL_TABLET | ORAL | Status: DC | PRN

## 2017-02-18 MED ORDER — ACETAMINOPHEN 500 MG PO TABS
1000.0000 mg | ORAL_TABLET | Freq: Once | ORAL | Status: AC
  Administered 2017-02-18: 1000 mg via ORAL
  Filled 2017-02-18: qty 2

## 2017-02-18 MED ORDER — ONDANSETRON HCL 4 MG/2ML IJ SOLN: 4.0000 mg | Freq: Four times a day (QID) | INTRAMUSCULAR | Status: DC | PRN

## 2017-02-18 MED ORDER — MORPHINE SULFATE (PF) 4 MG/ML IV SOLN
4.0000 mg | Freq: Once | INTRAVENOUS | Status: AC
  Administered 2017-02-18: 4 mg via INTRAVENOUS
  Filled 2017-02-18: qty 1

## 2017-02-18 MED ORDER — ONDANSETRON HCL 4 MG/2ML IJ SOLN
4.0000 mg | Freq: Once | INTRAMUSCULAR | Status: AC
  Administered 2017-02-18: 4 mg via INTRAVENOUS
  Filled 2017-02-18: qty 2

## 2017-02-18 MED ORDER — ACETAMINOPHEN 650 MG RE SUPP: 650.0000 mg | Freq: Four times a day (QID) | RECTAL | Status: DC | PRN

## 2017-02-18 MED ORDER — SODIUM CHLORIDE 0.9 % IV SOLN
INTRAVENOUS | Status: DC
  Administered 2017-02-18 – 2017-02-19 (×2): via INTRAVENOUS

## 2017-02-18 MED ORDER — CHLORHEXIDINE GLUCONATE 0.12 % MT SOLN
15.0000 mL | Freq: Two times a day (BID) | OROMUCOSAL | Status: DC
  Administered 2017-02-19: 15 mL via OROMUCOSAL
  Filled 2017-02-18: qty 15

## 2017-02-18 MED ORDER — ONDANSETRON HCL 4 MG PO TABS: 4.0000 mg | ORAL_TABLET | Freq: Four times a day (QID) | ORAL | Status: DC | PRN

## 2017-02-18 NOTE — ED Notes (Signed)
ULTRASOUND AT BEDSIDE

## 2017-02-18 NOTE — Consult Note (Signed)
Reason for Consult:abdominal pain Referring Physician: Little MD  Brittany Romero is an 46 y.o. female.  HPI: asked to see patient at the request of Dr.  Rex Kras for abdominal pain and gallstones. She has a 1 day history of abdomin She also gives a history of cough and has a fever to 102. Part of her workup of her abdomin She does have intermittent right  She also had some nausea and  Vomiting. She complains of cough and congestion. She has a runny nose.  Past Medical History:  Diagnosis Date  . Arthritis   . GERD (gastroesophageal reflux disease)   . Hypertension     Past Surgical History:  Procedure Laterality Date  . ABDOMINAL HYSTERECTOMY      No family history on file.  Social History:  reports that she has never smoked. She has never used smokeless tobacco. She reports that she does not drink alcohol or use drugs.  Allergies: No Known Allergies  Medications: I have reviewed the patient's current medications.  Results for orders placed or performed during the hospital encounter of 02/18/17 (from the past 48 hour(s))  Urinalysis, Routine w reflex microscopic     Status: Abnormal   Collection Time: 02/18/17  5:31 PM  Result Value Ref Range   Color, Urine YELLOW YELLOW   APPearance CLEAR CLEAR   Specific Gravity, Urine 1.011 1.005 - 1.030   pH 7.0 5.0 - 8.0   Glucose, UA NEGATIVE NEGATIVE mg/dL   Hgb urine dipstick NEGATIVE NEGATIVE   Bilirubin Urine NEGATIVE NEGATIVE   Ketones, ur 20 (A) NEGATIVE mg/dL   Protein, ur NEGATIVE NEGATIVE mg/dL   Nitrite NEGATIVE NEGATIVE   Leukocytes, UA NEGATIVE NEGATIVE  Lipase, blood     Status: None   Collection Time: 02/18/17  6:28 PM  Result Value Ref Range   Lipase 28 11 - 51 U/L  Comprehensive metabolic panel     Status: None   Collection Time: 02/18/17  6:28 PM  Result Value Ref Range   Sodium 140 135 - 145 mmol/L   Potassium 3.5 3.5 - 5.1 mmol/L   Chloride 104 101 - 111 mmol/L   CO2 26 22 - 32 mmol/L   Glucose, Bld 94 65 - 99  mg/dL   BUN 11 6 - 20 mg/dL   Creatinine, Ser 0.55 0.44 - 1.00 mg/dL   Calcium 9.1 8.9 - 10.3 mg/dL   Total Protein 7.8 6.5 - 8.1 g/dL   Albumin 4.6 3.5 - 5.0 g/dL   AST 27 15 - 41 U/L   ALT 28 14 - 54 U/L   Alkaline Phosphatase 100 38 - 126 U/L   Total Bilirubin 0.5 0.3 - 1.2 mg/dL   GFR calc non Af Amer >60 >60 mL/min   GFR calc Af Amer >60 >60 mL/min    Comment: (NOTE) The eGFR has been calculated using the CKD EPI equation. This calculation has not been validated in all clinical situations. eGFR's persistently <60 mL/min signify possible Chronic Kidney Disease.    Anion gap 10 5 - 15  CBC     Status: None   Collection Time: 02/18/17  6:28 PM  Result Value Ref Range   WBC 7.2 4.0 - 10.5 K/uL   RBC 4.51 3.87 - 5.11 MIL/uL   Hemoglobin 13.1 12.0 - 15.0 g/dL   HCT 39.0 36.0 - 46.0 %   MCV 86.5 78.0 - 100.0 fL   MCH 29.0 26.0 - 34.0 pg   MCHC 33.6 30.0 - 36.0 g/dL  RDW 13.4 11.5 - 15.5 %   Platelets 155 150 - 400 K/uL  I-Stat beta hCG blood, ED     Status: None   Collection Time: 02/18/17  6:48 PM  Result Value Ref Range   I-stat hCG, quantitative <5.0 <5 mIU/mL   Comment 3            Comment:   GEST. AGE      CONC.  (mIU/mL)   <=1 WEEK        5 - 50     2 WEEKS       50 - 500     3 WEEKS       100 - 10,000     4 WEEKS     1,000 - 30,000        FEMALE AND NON-PREGNANT FEMALE:     LESS THAN 5 mIU/mL   I-Stat CG4 Lactic Acid, ED     Status: None   Collection Time: 02/18/17  6:49 PM  Result Value Ref Range   Lactic Acid, Venous 0.58 0.5 - 1.9 mmol/L  Influenza panel by PCR (type A & B)     Status: Abnormal   Collection Time: 02/18/17  7:02 PM  Result Value Ref Range   Influenza A By PCR NEGATIVE NEGATIVE   Influenza B By PCR POSITIVE (A) NEGATIVE    Comment: (NOTE) The Xpert Xpress Flu assay is intended as an aid in the diagnosis of  influenza and should not be used as a sole basis for treatment.  This  assay is FDA approved for nasopharyngeal swab specimens only.  Nasal  washings and aspirates are unacceptable for Xpert Xpress Flu testing.     Dg Chest 2 View  Result Date: 02/18/2017 CLINICAL DATA:  Right upper quadrant abdominal pain. Nonproductive cough. Fever and chills. EXAM: CHEST  2 VIEW COMPARISON:  None. FINDINGS: Normal sized heart. Clear lungs. Minimal peribronchial thickening. Minimal thoracic spine degenerative changes. IMPRESSION: Minimal bronchitic changes. Electronically Signed   By: Claudie Revering M.D.   On: 02/18/2017 18:46   US Abdomen Limited Ruq  Result Date: 02/18/2017 CLINICAL DATA:  46 y/o  F; right upper quadrant pain with vomiting. EXAM: US ABDOMEN LIMITED - RIGHT UPPER QUADRANT COMPARISON:  None. FINDINGS: Gallbladder: No gallbladder wall thickening or pericholecystic fluid. Negative sonographic Murphy's sign. Echogenic foci are present within the gallbladder compatible with small stones. Common bile duct: Diameter: 5.3 mm Liver: Diffusely increased echogenicity.  No focal lesion identified. IMPRESSION: 1. Cholelithiasis.  No secondary signs of acute cholecystitis. 2. Hepatic steatosis. Electronically Signed   By: Kristine Garbe M.D.   On: 02/18/2017 19:18    Review of Systems  Constitutional: Positive for chills, fever and malaise/fatigue.  HENT: Negative for hearing loss and tinnitus.   Eyes: Negative for blurred vision and double vision.  Respiratory: Positive for cough, sputum production and wheezing.   Cardiovascular: Positive for chest pain and palpitations.  Gastrointestinal: Positive for abdominal pain.  Genitourinary: Negative for dysuria and urgency.  Musculoskeletal: Positive for myalgias.  Skin: Negative for itching and rash.  Neurological: Negative for dizziness.  Psychiatric/Behavioral: Positive for depression.   Blood pressure 127/87, pulse 93, temperature (!) 102 F (38.9 C), temperature source Oral, resp. rate 18, height _0  (1.626 m), weight 72.6 kg (160 lb), last menstrual period 06/23/2011, SpO2  94 %. Physical Exam  Constitutional: She is oriented to person, place, and time. She appears well-developed and well-nourished.  HENT:  Head: Normocephalic and atraumatic.  Nose: Rhinorrhea present.  Eyes: Pupils are equal, round, and reactive to light.  Cardiovascular: Normal rate and regular rhythm.   Respiratory: Effort normal. She has wheezes.  GI: There is tenderness in the right upper quadrant. There is no rigidity, no rebound and no guarding.  Musculoskeletal: Normal range of motion.  Neurological: She is alert and oriented to person, place, and time.  Skin: Skin is warm and dry.  Psychiatric: She has a normal mood and affect. Her behavior is normal. Judgment and thought content normal.    Assessment/Plan: Gallstones symptomatic colic    URI-   ASK MEDICINE TO SEE GIVEN FEVER. I DO NOT THINK THIS IS FROM HER GALLBLADDER.   Normal labs but will need gallbladder out at some point    Clear liquids ok until midnight.  NPO after midnight. Will reassess in am per [facility].     Evani Shrider A. 02/18/2017, 8:23 PM

## 2017-02-18 NOTE — ED Notes (Signed)
Patient transported to X-ray 

## 2017-02-18 NOTE — H&P (Signed)
History and Physical    Brittany Romero JIR:[id number] DOB: 05/28/1971 DOA: 02/18/2017  PCP: Cathleen Corti, PA-C   Patient coming from: Home  Chief Complaint: Nausea, vomiting, abdominal pain, cough, fever  HPI: Brittany Romero is a 46 y.o. woman (Spanish speaking) with a history of HTN, GERD, and OA who feels that she was in her baseline state of health until three days ago.  Patient speaks some English, but no as much as her daughters, who translate for her at bedside.  She has had fever (as high has 105), chills, sweats, and nasal congestion.  She has had a cough productive of clear sputum.  She has had clear nasal drainage.  She has had fatigue.  She has also had nausea and vomiting.  She has had intermittent RUQ abdominal pain, 10 out of 10 in intensity at its worst, exacerbated by eating.  It has not been relieved by Advil.  No known sick contacts.  ED Course: Tmax 102.  LA 0.58.  CBC, CMP essentially normal.  Lipase 28.  Chest xray shows minimal bronchitis changes; otherwise negative.  Abdominal ultrasound shows gallstones and hepatic steatosis.  Blood and urine cultures pending  General surgery called for cholelithiasis.  Hospitalist asked to admit.  Flu screen ultimately positive for influenza B.   Review of Systems: As per HPI otherwise 10 systems reviewed and negative.   Past Medical History:  Diagnosis Date  . Arthritis   . GERD (gastroesophageal reflux disease)   . Hypertension     Past Surgical History:  Procedure Laterality Date  . ABDOMINAL HYSTERECTOMY       reports that she has never smoked. She has never used smokeless tobacco. She reports that she does not drink alcohol or use drugs.  No Known Allergies  FAMILY HISTORY: One sister died of complications related to uterine cancer.  Prior to Admission medications   Not on File    Physical Exam: Vitals:   02/18/17 1728 02/18/17 1729 02/18/17 1914 02/18/17 2032  BP: 128/89  127/87 128/78  Pulse: 96  93  88  Resp: 18  18 16   Temp: (!) 100.8 F (38.2 C)  (!) 102 F (38.9 C) 99.2 F (37.3 C)  TempSrc: Oral  Oral Oral  SpO2: 100%  94% 99%  Weight:  72.6 kg (160 lb)    Height:  5\' 4"  (1.626 m)        Constitutional: NAD, calm, ill-appearing Vitals:   02/18/17 1728 02/18/17 1729 02/18/17 1914 02/18/17 2032  BP: 128/89  127/87 128/78  Pulse: 96  93 88  Resp: 18  18 16   Temp: (!) 100.8 F (38.2 C)  (!) 102 F (38.9 C) 99.2 F (37.3 C)  TempSrc: Oral  Oral Oral  SpO2: 100%  94% 99%  Weight:  72.6 kg (160 lb)    Height:  5\' 4"  (1.626 m)     Eyes: PERRL, lids and conjunctivae normal ENMT: Mucous membranes are dry.  Normal dentition.  Neck: normal appearance, supple, no masses Respiratory: clear to auscultation bilaterally, no wheezing, no crackles. Normal respiratory effort. No accessory muscle use.  Cardiovascular: Normal rate, regular rhythm, no murmurs / rubs / gallops. No extremity edema. 2+ pedal pulses. GI: abdomen is soft and compressible.  She has epigastric, RUQ, and RLQ tenderness.  No guarding.  No significant distention.  Bowel sounds are present. Musculoskeletal:  No joint deformity in upper and lower extremities. Good ROM, no contractures. Normal muscle tone.  Skin: no rashes, warm and  dry Neurologic: CN 2-12 grossly intact. Sensation intact, Strength symmetric bilaterally. Psychiatric: Normal judgment and insight. Alert and oriented x 3. Normal mood.     Labs on Admission: I have personally reviewed following labs and imaging studies  CBC:  Recent Labs Lab 02/18/17 1828  WBC 7.2  HGB 13.1  HCT 39.0  MCV 86.5  PLT 671   Basic Metabolic Panel:  Recent Labs Lab 02/18/17 1828  NA 140  K 3.5  CL 104  CO2 26  GLUCOSE 94  BUN 11  CREATININE 0.55  CALCIUM 9.1   GFR: Estimated Creatinine Clearance: 86.8 mL/min (by C-G formula based on SCr of 0.55 mg/dL). Liver Function Tests:  Recent Labs Lab 02/18/17 1828  AST 27  ALT 28  ALKPHOS 100    BILITOT 0.5  PROT 7.8  ALBUMIN 4.6    Recent Labs Lab 02/18/17 1828  LIPASE 28   Urine analysis:    Component Value Date/Time   COLORURINE YELLOW 02/18/2017 1731   APPEARANCEUR CLEAR 02/18/2017 1731   LABSPEC 1.011 02/18/2017 1731   PHURINE 7.0 02/18/2017 1731   GLUCOSEU NEGATIVE 02/18/2017 1731   HGBUR NEGATIVE 02/18/2017 1731   BILIRUBINUR NEGATIVE 02/18/2017 1731   KETONESUR 20 (A) 02/18/2017 1731   PROTEINUR NEGATIVE 02/18/2017 1731   NITRITE NEGATIVE 02/18/2017 1731   LEUKOCYTESUR NEGATIVE 02/18/2017 1731   Sepsis Labs:  Lactic Acid, Venous    Component Value Date/Time   LATICACIDVEN 0.58 02/18/2017 1849    Radiological Exams on Admission: Dg Chest 2 View  Result Date: 02/18/2017 CLINICAL DATA:  Right upper quadrant abdominal pain. Nonproductive cough. Fever and chills. EXAM: CHEST  2 VIEW COMPARISON:  None. FINDINGS: Normal sized heart. Clear lungs. Minimal peribronchial thickening. Minimal thoracic spine degenerative changes. IMPRESSION: Minimal bronchitic changes. Electronically Signed   By: Claudie Revering M.D.   On: 02/18/2017 18:46   US Abdomen Limited Ruq  Result Date: 02/18/2017 CLINICAL DATA:  46 y/o  F; right upper quadrant pain with vomiting. EXAM: US ABDOMEN LIMITED - RIGHT UPPER QUADRANT COMPARISON:  None. FINDINGS: Gallbladder: No gallbladder wall thickening or pericholecystic fluid. Negative sonographic Murphy's sign. Echogenic foci are present within the gallbladder compatible with small stones. Common bile duct: Diameter: 5.3 mm Liver: Diffusely increased echogenicity.  No focal lesion identified. IMPRESSION: 1. Cholelithiasis.  No secondary signs of acute cholecystitis. 2. Hepatic steatosis. Electronically Signed   By: Kristine Garbe M.D.   On: 02/18/2017 19:18    Assessment/Plan Principal Problem:   Influenza B Active Problems:   Cholelithiasis      Influenza B infection --Symptoms present at least 72 hours; Tamiflu deferred at this  point --Supportive care --IV fluids, anti-emetics as needed, acetaminophen as needed for fever.  Cholelithiasis without acute cholecystitis --General surgery consult appreciated.  Patient to be re-evaluated in the AM, but I would not expect her to go to the OR with positive influenza.   DVT prophylaxis: Low risk, outpatient status Code Status: FULL Family Communication: Two daughters at bedside. Disposition Plan: Expect she will go home at discharge. Consults called: General Surgery Admission status: Place in observation, med surg.   TIME SPENT: 50 minutes   Eber Jones MD [facility] Pager [phone]  If 7PM-7AM, please contact night-coverage [email or web address] Password [facility]  02/18/2017, 9:37 PM

## 2017-02-18 NOTE — ED Notes (Signed)
Bed: EU99 Expected date:  Expected time:  Means of arrival:  Comments: 46 yo f abd pain

## 2017-02-18 NOTE — ED Provider Notes (Signed)
[facility] DEPT Provider Note   CSN: [id number] Arrival date & time: 02/18/17  1643     History   Chief Complaint Chief Complaint  Patient presents with  . Abdominal Pain  . Emesis    HPI Brittany Romero is a 46 y.o. female.  46 year old female with past medical history including hypertension who presents with abdominal pain and vomiting. History obtained with the assistance of the patient's daughters who are at bedside. Patient reports 3 days of intermittent upper abdominal pain associated with vomiting. The pain is worse after eating. She denies any associated diarrhea, last bowel movement was this morning. She reports fevers and chills today and was noted to have a fever at a clinic, sent here for further evaluation. She reports a few days of cough and chills with nasal congestion. She does endorse dysuria. Of note, she states that in the past she has had some pain in the RUQ after eating.   The history is provided by the patient and a relative.  Abdominal Pain   Associated symptoms include vomiting.  Emesis   Associated symptoms include abdominal pain.    Past Medical History:  Diagnosis Date  . Arthritis   . GERD (gastroesophageal reflux disease)   . Hypertension     There are no active problems to display for this patient.   Past Surgical History:  Procedure Laterality Date  . ABDOMINAL HYSTERECTOMY      OB History    No data available       Home Medications    Prior to Admission medications   Medication Sig Start Date End Date Taking? Authorizing Provider  lisinopril-hydrochlorothiazide (PRINZIDE,ZESTORETIC) 10-12.5 MG per tablet Take 1 tablet by mouth daily. Patient not taking: Reported on 02/18/2017 01/06/14   Robyn Haber, MD  loperamide (IMODIUM A-D) 2 MG tablet 2 now and one after each loose stool as often as q1h.  Max 8 in 24 h Patient not taking: Reported on 02/18/2017 11/04/13   Roselee Culver, MD  methylPREDNISolone (MEDROL) 4 MG tablet 2  tablets al dia Patient not taking: Reported on 02/18/2017 01/09/14   Robyn Haber, MD  ondansetron (ZOFRAN-ODT) 8 MG disintegrating tablet Take 1 tablet (8 mg total) by mouth every 8 (eight) hours as needed for nausea. Patient not taking: Reported on 02/18/2017 11/04/13   Roselee Culver, MD  predniSONE (DELTASONE) 20 MG tablet Take 2 tablets (40 mg total) by mouth daily. Patient not taking: Reported on 02/18/2017 01/06/14   Robyn Haber, MD    Family History No family history on file.  Social History Social History  Substance Use Topics  . Smoking status: Never Smoker  . Smokeless tobacco: Never Used  . Alcohol use No     Allergies   Patient has no known allergies.   Review of Systems Review of Systems  Gastrointestinal: Positive for abdominal pain and vomiting.   All other systems reviewed and are negative except that which was mentioned in HPI  Physical Exam Updated Vital Signs BP 128/78 (BP Location: Right Arm)   Pulse 88   Temp 99.2 F (37.3 C) (Oral)   Resp 16   Ht 5\' 4"  (1.626 m)   Wt 160 lb (72.6 kg)   LMP 06/23/2011   SpO2 99%   BMI 27.46 kg/m   Physical Exam  Constitutional: She is oriented to person, place, and time. She appears well-developed and well-nourished.  uncomfortable  HENT:  Head: Normocephalic and atraumatic.  Nasal congestion, Moist mucous membranes  Eyes:  Conjunctivae are normal. Pupils are equal, round, and reactive to light.  Neck: Neck supple.  Cardiovascular: Regular rhythm and normal heart sounds.  Tachycardia present.   No murmur heard. Pulmonary/Chest: Effort normal and breath sounds normal.  Abdominal: Soft. Bowel sounds are normal. She exhibits no distension. There is tenderness in the right upper quadrant and epigastric area. There is positive Murphy's sign. There is no rebound and no guarding.  Musculoskeletal: She exhibits no edema.  Neurological: She is alert and oriented to person, place, and time.  Fluent speech  Skin:  Skin is warm and dry.  Psychiatric: Judgment normal.  anxious  Nursing note and vitals reviewed.    ED Treatments / Results  Labs (all labs ordered are listed, but only abnormal results are displayed) Labs Reviewed  URINALYSIS, ROUTINE W REFLEX MICROSCOPIC - Abnormal; Notable for the following:       Result Value   Ketones, ur 20 (*)    All other components within normal limits  INFLUENZA PANEL BY PCR (TYPE A & B) - Abnormal; Notable for the following:    Influenza B By PCR POSITIVE (*)    All other components within normal limits  CULTURE, BLOOD (ROUTINE X 2)  URINE CULTURE  LIPASE, BLOOD  COMPREHENSIVE METABOLIC PANEL  CBC  I-STAT BETA HCG BLOOD, ED ([facility], [facility], [facility] ONLY)  I-STAT CG4 LACTIC ACID, ED    EKG  EKG Interpretation None       Radiology Dg Chest 2 View  Result Date: 02/18/2017 CLINICAL DATA:  Right upper quadrant abdominal pain. Nonproductive cough. Fever and chills. EXAM: CHEST  2 VIEW COMPARISON:  None. FINDINGS: Normal sized heart. Clear lungs. Minimal peribronchial thickening. Minimal thoracic spine degenerative changes. IMPRESSION: Minimal bronchitic changes. Electronically Signed   By: Claudie Revering M.D.   On: 02/18/2017 18:46   US Abdomen Limited Ruq  Result Date: 02/18/2017 CLINICAL DATA:  46 y/o  F; right upper quadrant pain with vomiting. EXAM: US ABDOMEN LIMITED - RIGHT UPPER QUADRANT COMPARISON:  None. FINDINGS: Gallbladder: No gallbladder wall thickening or pericholecystic fluid. Negative sonographic Murphy's sign. Echogenic foci are present within the gallbladder compatible with small stones. Common bile duct: Diameter: 5.3 mm Liver: Diffusely increased echogenicity.  No focal lesion identified. IMPRESSION: 1. Cholelithiasis.  No secondary signs of acute cholecystitis. 2. Hepatic steatosis. Electronically Signed   By: Kristine Garbe M.D.   On: 02/18/2017 19:18    Procedures Procedures (including critical care time)  Medications Ordered in  ED Medications  sodium chloride 0.9 % bolus 1,000 mL (0 mLs Intravenous Stopped 02/18/17 2032)  morphine 4 MG/ML injection 4 mg (4 mg Intravenous Given 02/18/17 1846)  ondansetron (ZOFRAN) injection 4 mg (4 mg Intravenous Given 02/18/17 1846)  acetaminophen (TYLENOL) tablet 1,000 mg (1,000 mg Oral Given 02/18/17 1856)     Initial Impression / Assessment and Plan / ED Course  I have reviewed the triage vital signs and the nursing notes.  Pertinent labs & imaging results that were available during my care of the patient were reviewed by me and considered in my medical decision making (see chart for details).    PT w/ 3 days of upper abdominal pain and vomiting, also with fever/chills and cough. She was uncomfortable on exam but nontoxic. VS notable for mild tachycardia, normal BP, T 100.8. She had right upper quadrant and midepigastric tenderness with positive Murphy's sign. Obtained above labs including blood and urine cultures because of her fever. I am concerned about potential gallbladder pathology,  therefore obtained right upper quadrant ultrasound. Gave the patient morphine, Zofran, Tylenol, and an IV fluid bolus.  Labwork shows normal lactate, unremarkable CMP and lipase, normal CBC. The patient's influenza panel was positive for influenza B. She has had a few days of symptoms and has no significant comorbidities to make her at risk for complications therefore I do not feel she needs Tamiflu at this time.  Her ultrasound does show cholelithiasis without any evidence of acute cholecystitis. I did contact general surgery because of her persistent abdominal pain and vomiting for several days and positive findings on ultrasound. Patient was evaluated by Dr. Brantley Stage who recommended liquid diet, NPO at midnight for reassessment in the morning. Because of patient's concurrent influenza, discussed admission with hospitalist Dr. Eulas Post and patient admitted for further care. Final Clinical Impressions(s) / ED  Diagnoses   Final diagnoses:  RUQ pain  Influenza B  Symptomatic cholelithiasis    New Prescriptions New Prescriptions   No medications on file     Sharlett Iles, MD 02/18/17 2052

## 2017-02-18 NOTE — ED Triage Notes (Signed)
Per [facility]- Pt was a walk in clinic c/o of stomach pain x 3 days. Increased upon palpation. Vomited this am. Nonproductive cough, fever, chills.  OTC meds without relief. Spanish speaking with limited english

## 2017-02-18 NOTE — ED Notes (Signed)
BLOOD CULTURE X 1 5CC/EACH LEFT FOREARM

## 2017-02-19 ENCOUNTER — Observation Stay (HOSPITAL_COMMUNITY): Payer: BLUE CROSS/BLUE SHIELD

## 2017-02-19 DIAGNOSIS — R110 Nausea: Secondary | ICD-10-CM

## 2017-02-19 DIAGNOSIS — J101 Influenza due to other identified influenza virus with other respiratory manifestations: Secondary | ICD-10-CM | POA: Diagnosis not present

## 2017-02-19 DIAGNOSIS — K8020 Calculus of gallbladder without cholecystitis without obstruction: Secondary | ICD-10-CM | POA: Diagnosis not present

## 2017-02-19 MED ORDER — ACETAMINOPHEN 325 MG PO TABS: 650.0000 mg | ORAL_TABLET | Freq: Four times a day (QID) | ORAL | 0 refills | Status: AC | PRN

## 2017-02-19 MED ORDER — ONDANSETRON 8 MG PO TBDP: 8.0000 mg | ORAL_TABLET | Freq: Three times a day (TID) | ORAL | 0 refills | Status: DC | PRN

## 2017-02-19 MED ORDER — DEXTROMETHORPHAN POLISTIREX ER 30 MG/5ML PO SUER
15.0000 mg | Freq: Three times a day (TID) | ORAL | Status: DC | PRN
  Filled 2017-02-19: qty 5

## 2017-02-19 MED ORDER — DEXTROMETHORPHAN POLISTIREX ER 30 MG/5ML PO SUER: 15.0000 mg | Freq: Three times a day (TID) | ORAL | 0 refills | Status: DC | PRN

## 2017-02-19 MED ORDER — ACETAMINOPHEN 325 MG PO TABS: 650.0000 mg | ORAL_TABLET | Freq: Four times a day (QID) | ORAL | 0 refills | Status: DC | PRN

## 2017-02-19 MED ORDER — LORATADINE 10 MG PO TABS: 10.0000 mg | ORAL_TABLET | Freq: Every day | ORAL | 0 refills | Status: DC

## 2017-02-19 MED ORDER — LORATADINE 10 MG PO TABS: 10.0000 mg | ORAL_TABLET | Freq: Every day | ORAL | Status: DC

## 2017-02-19 MED ORDER — TECHNETIUM TC 99M MEBROFENIN IV KIT
5.3000 | PACK | Freq: Once | INTRAVENOUS | Status: AC | PRN
  Administered 2017-02-19: 5.3 via INTRAVENOUS

## 2017-02-19 MED ORDER — ONDANSETRON 8 MG PO TBDP
8.0000 mg | ORAL_TABLET | Freq: Three times a day (TID) | ORAL | 0 refills | Status: DC | PRN
Start: 2017-02-19 — End: 2017-02-19

## 2017-02-19 NOTE — Discharge Instructions (Signed)
Colelitiasis (Cholelithiasis) La colelitiasis (tambin llamada clculos en la vescula) es una enfermedad en la que se forman piedras en la vescula. La vescula es un rgano que almacena la bilis que se forma en el hgado y que ayuda a Licensed conveyancer. Los clculos comienzan como pequeos cristales y lentamente se transforman en piedras. El dolor en la vescula ocurre cuando se producen espasmos y los clculos obstruyen el conducto. El dolor tambin se produce cuando una piedra sale por el conducto. FACTORES DE RIESGO  Ser mujer.  Tener embarazos mltiples. Algunas veces los mdicos aconsejan extirpar los clculos biliares antes de futuros embarazos.  Ser obeso.  Dietas que incluyan comidas fritas y grasas.  Ser mayor de 70 aos y el aumento de la edad.  El uso prolongado de medicamentos que contengan hormonas femeninas.  Tener diabetes mellitus.  Prdida rpida de peso.  Historia familiar de clculos (herencia). SNTOMAS  Nuseas.  Vmitos.  Dolor abdominal.  Piel amarilla (ictericia)  Dolor sbito. Puede persistir desde algunos minutos hasta algunas horas.  Cristy Hilts.  Sensibilidad al tacto. En algunos casos, cuando los clculos biliares no se mueven hacia el conducto biliar, las personas no sienten dolor ni presentan sntomas. Estos se denominan clculos silenciosos. TRATAMIENTO Los clculos silenciosos no requieren Clinical research associate. En los [employer], podr ser [place]. Las opciones de tratamiento son:  Dwaine Gale para extirpar la vescula. Es el tratamiento ms frecuente.  Gripe en los adultos (Influenza, Adult) La gripe es una infeccin en los pulmones, la nariz y la garganta (vas respiratorias). La causa un virus. La gripe provoca muchos sntomas del resfro comn, as como fiebre alta y Hydrologist. Puede hacer que se sienta muy mal. Se transmite fcilmente de persona a persona (es contagiosa). La mejor manera de prevenir la gripe es  aplicndose la vacuna contra la gripe todos los aos. CUIDADOS EN EL HOGAR Tome los medicamentos de venta libre y los recetados solamente como se lo haya indicado el mdico. Use un humidificador de aire fro para que el aire de su casa est ms hmedo. Esto puede facilitar la respiracin. Descanse todo lo que sea necesario. Beba suficiente lquido para mantener el pis (orina) claro o de color amarillo plido. Al toser o estornudar, cbrase la boca y la [place]. Lvese las manos con agua y jabn frecuentemente, en especial despus de toser o Brewing technologist. Use un desinfectante para manos si no dispone de [place] y [place]. Foy [place] en su casa y no concurra al [place] o a la escuela, como se lo haya indicado el mdico. A menos que deba ir al [employer], evite salir de su casa hasta que no tenga fiebre durante 24horas sin el uso de medicamentos. Concurra a todas las visitas de control como se lo haya indicado el mdico. Esto es importante. PREVENCIN Aplicarse la vacuna anual contra la gripe es la mejor manera de evitar contagiarse la gripe. Puede aplicarse la vacuna contra la gripe a fines de verano, en otoo o en invierno. Pregntele al mdico cundo debe aplicarse la vacuna contra la gripe. Lvese las manos o use un desinfectante de manos con frecuencia. Evite el contacto con personas que estn enfermas durante la temporada de resfro y gripe. Consuma alimentos saludables. Beba abundantes lquidos. Duerma lo suficiente. Haga ejercicios regularmente. SOLICITE AYUDA SI: Tiene sntomas nuevos. Tiene los siguientes sntomas: Tourist information centre manager. Deposiciones lquidas (diarrea). Cristy Hilts. La tos empeora. Empieza a tener ms mucosidad. Siente malestar estomacal (nuseas). Vomita. SOLICITE AYUDA DE INMEDIATO SI: Siente que le  falta el aire o tiene dificultad para respirar. La piel o las uas se tornan de un color [place]. Presenta un dolor intenso o rigidez en el cuello. Siente dolor de cabeza de forma  repentina. Le duele la cara o el odo de forma repentina. No puede detener los vmitos. Esta informacin no tiene Marine scientist el consejo del mdico. Asegrese de hacerle al mdico cualquier pregunta que tenga. Document Released: 01/01/2009 Document Revised: 01/27/2016 Document Reviewed: 07/30/2015 Elsevier Interactive Patient Education  2017 [place]. No siempre dan resultado y pueden demorar entre 6 y 5 meses o ms en Chief of Staff.  Tratamiento con ondas de choque (litotricia biliar extracorporal). En este tratamiento, una mquina de ultrasonido enva ondas de choque a la vescula para destruir los clculos en pequeos fragmentos que luego podrn pasar a los intestinos o ser disueltas con medicamentos. INSTRUCCIONES PARA EL CUIDADO EN EL HOGAR  Slo tome medicamentos de venta libre o recetados para Glass blower/designer, Health and safety inspector o bajar la fiebre, segn las indicaciones de su mdico.  Siga una dieta baja en grasas hasta que su mdico lo vea nuevamente. Las grasas hacen que la vescula se Location manager, lo que puede Orthoptist.  Concurra a las consultas de control con su mdico segn las indicaciones. Los ataques casi siempre son recurrentes y generalmente habr que someterse a una ciruga como [place]. SOLICITE ATENCIN MDICA DE INMEDIATO SI:  El dolor aumenta y no puede controlarlo con los medicamentos.  Tiene fiebre o sntomas persistentes durante ms de 2 - 3 das.  Tiene fiebre y los sntomas empeoran repentinamente.  Tiene nuseas o vmitos persistentes. ASEGRESE DE QUE:  Comprende estas instrucciones.  Controlar su afeccin.  Recibir ayuda de inmediato si no mejora o si empeora. Esta informacin no tiene Marine scientist el consejo del mdico. Asegrese de hacerle al mdico cualquier pregunta que tenga. Document Released: 07/22/2006 Document Revised: 06/07/2013 Document Reviewed: 03/29/2013 Elsevier Interactive Patient  Education  2017 [employer].

## 2017-02-19 NOTE — Progress Notes (Signed)
Assessment Principal Problem:   Acute Influenza B Active Problems:   Cholelithiasis-no radiographic or chemical signs of acute cholecystitis although still has some tenderness today.   Plan:  HIDA scan.  If positive, will treat with abxs and future cholecystectomy.  Will likely need this anyway.  Will need Influenza to resolve prior to surgery.   LOS: 0 days        Chief Complaint/Subjective: Less abdominal pain.  Still coughing.  Objective: Vital signs in last 24 hours: Temp:  [98.6 F (37 C)-102 F (38.9 C)] 98.6 F (37 C) (05/04 1540) Pulse Rate:  [67-96] 67 (05/04 0614) Resp:  [16-18] 18 (05/04 0614) BP: (119-128)/(77-89) 119/79 (05/04 0614) SpO2:  [94 %-100 %] 98 % (05/04 0614) Weight:  [72.6 kg (160 lb)-73.6 kg (162 lb 4.1 oz)] 73.6 kg (162 lb 4.1 oz) (05/03 2241) Last BM Date: 02/18/17  Intake/Output from previous day: 05/03 0701 - 05/04 0700 In: 920 [P.O.:120; I.V.:800] Out: -  Intake/Output this shift: No intake/output data recorded.  PE: General- In NAD.  Awake and alert. Abdomen-soft, mild RUQ and epigastric tenderness  Lab Results:   Recent Labs  02/18/17 1828  WBC 7.2  HGB 13.1  HCT 39.0  PLT 155   BMET  Recent Labs  02/18/17 1828  NA 140  K 3.5  CL 104  CO2 26  GLUCOSE 94  BUN 11  CREATININE 0.55  CALCIUM 9.1   PT/INR No results for input(s): LABPROT, INR in the last 72 hours. Comprehensive Metabolic Panel:    Component Value Date/Time   NA 140 02/18/2017 1828   NA 138 01/15/2014 1111   K 3.5 02/18/2017 1828   K 3.6 01/15/2014 1111   CL 104 02/18/2017 1828   CL 97 01/15/2014 1111   CO2 26 02/18/2017 1828   CO2 30 01/15/2014 1111   BUN 11 02/18/2017 1828   BUN 12 01/15/2014 1111   CREATININE 0.55 02/18/2017 1828   CREATININE 0.51 01/15/2014 1111   CREATININE 0.49 (L) 01/06/2014 1559   GLUCOSE 94 02/18/2017 1828   GLUCOSE 66 (L) 01/15/2014 1111   CALCIUM 9.1 02/18/2017 1828   CALCIUM 9.7 01/15/2014 1111   AST 27  02/18/2017 1828   AST 15 01/15/2014 1111   ALT 28 02/18/2017 1828   ALT 52 (H) 01/15/2014 1111   ALKPHOS 100 02/18/2017 1828   ALKPHOS 92 01/15/2014 1111   BILITOT 0.5 02/18/2017 1828   BILITOT 0.6 01/15/2014 1111   PROT 7.8 02/18/2017 1828   PROT 8.1 01/15/2014 1111   ALBUMIN 4.6 02/18/2017 1828   ALBUMIN 4.8 01/15/2014 1111     Studies/Results: Dg Chest 2 View  Result Date: 02/18/2017 CLINICAL DATA:  Right upper quadrant abdominal pain. Nonproductive cough. Fever and chills. EXAM: CHEST  2 VIEW COMPARISON:  None. FINDINGS: Normal sized heart. Clear lungs. Minimal peribronchial thickening. Minimal thoracic spine degenerative changes. IMPRESSION: Minimal bronchitic changes. Electronically Signed   By: Claudie Revering M.D.   On: 02/18/2017 18:46   US Abdomen Limited Ruq  Result Date: 02/18/2017 CLINICAL DATA:  46 y/o  F; right upper quadrant pain with vomiting. EXAM: US ABDOMEN LIMITED - RIGHT UPPER QUADRANT COMPARISON:  None. FINDINGS: Gallbladder: No gallbladder wall thickening or pericholecystic fluid. Negative sonographic Murphy's sign. Echogenic foci are present within the gallbladder compatible with small stones. Common bile duct: Diameter: 5.3 mm Liver: Diffusely increased echogenicity.  No focal lesion identified. IMPRESSION: 1. Cholelithiasis.  No secondary signs of acute cholecystitis. 2. Hepatic steatosis. Electronically Signed  By: Kristine Garbe M.D.   On: 02/18/2017 19:18    Anti-infectives: Anti-infectives    None       Tai Skelly J 02/19/2017

## 2017-02-19 NOTE — Progress Notes (Signed)
Patient dc'd to home, daughter at bedside. Discharge instructions given to patient in [place], and in [place]. Patient and daughter states that they understand

## 2017-02-19 NOTE — Discharge Summary (Signed)
Physician Discharge Summary  Brittany Romero YBO:[id number] DOB: 05-21-1971 DOA: 02/18/2017  PCP: Brantley Stage A, PA-C  Admit date: 02/18/2017 Discharge date: 02/19/2017  Time spent: 35 minutes  Recommendations for Outpatient Follow-up:  1.  Repeat CMET  To follow electrolytes and liver function 2.  Reassesses complete resolution of patient's influenza symptoms 3.  patient to follow-up with general surgery for elective cholecystectomy.   Discharge Diagnoses:  Principal Problem:   Influenza B Active Problems:   Cholelithiasis   Nausea hepatic steatosis   Discharge Condition:  Stable and improved overall. Patient discharged home with instructions to follow up with PCP in 1 week and with general surgery in 2 weeks.  Diet recommendation:  low-fat diet  Filed Weights   02/18/17 1729 02/18/17 2241  Weight: 72.6 kg (160 lb) 73.6 kg (162 lb 4.1 oz)    History of present illness:  As per H&P written by Dr. Eulas Post on 02/18/17  46 y.o. woman (Spanish speaking) with a history of HTN, GERD, and OA who feels that she was in her baseline state of health until three days ago.  Patient speaks some English, but no as much as her daughters, who translate for her at bedside.  She has had fever (as high has 105), chills, sweats, and nasal congestion.  She has had a cough productive of clear sputum.  She has had clear nasal drainage.  She has had fatigue.  She has also had nausea and vomiting.  She has had intermittent RUQ abdominal pain, 10 out of 10 in intensity at its worst, exacerbated by eating.  It has not been relieved by Advil.  No known sick contacts.  Hospital Course:  1-nasal congestion, intermittent coughing spells, nausea and general malaise: Secondary to influenza being infection. -Patient symptomatically improving and out of the therapeutic window for Tamiflu use -patient has been instructed to keep herself well hydrated -To use Delsym, Tylenol and Zofran as needed for symptomatic  management -Patient encouraged to arrange follow-up with her PCP in one week  2-cholelithiasis -Patient with no elevated wbc's and normal liver function test -Hyda Scan reassuring with no cystic duct obstructions or acute cholecystitis -patient will follow up as an outpatient with general surgery naproxen 2 weeks, at that time plan is to arrange elective cholecystectomy. -advised to follow a low-fat diet and to use as needed analgesics.  3-hepatic steatosis -Patient advised to follow low fat diet. -Normal LFTs  Procedures:  Hida Scan: Patent biliary tree without evidence of cystic duct obstruction or acute cholecystitis.  Consultations:  Gen. surgery  Discharge Exam: Vitals:   02/18/17 2241 02/19/17 0614  BP: 119/77 119/79  Pulse: 77 67  Resp: 18 18  Temp: 98.6 F (37 C) 98.6 F (37 C)    General: feeling better, no chest pain, intermittent coughing spells; denies shortness of breath and endorses improvement in her abdominal discomfort. No vomiting and endorses just mild nausea. Cardiovascular: S1 and S2, positive systolic ejection murmur, no rubs, no gallops Respiratory: scattered rhonchi, no wheezing, no crackles, good air movement bilaterally. Abdomen soft, non-distended, positive bowel sounds; slight tenderness to palpation on her right upper quadrant. No guarding Extremities: No edema, no cyanosis or clubbing.  Discharge Instructions   Discharge Instructions    Discharge instructions    Complete by:  As directed    Keep yourself well hydrated Take medications as prescribed  Follow a low fat diet  Follow up with general surgery in 2 weeks (Dr. Zella Richer) Arrange follow up with PCP in  1 week.     Current Discharge Medication List    START taking these medications   Details  acetaminophen (TYLENOL) 325 MG tablet Take 2 tablets (650 mg total) by mouth every 6 (six) hours as needed for mild pain or headache (or Fever >/= 101.5). Qty: 40 tablet, Refills: 0     dextromethorphan (DELSYM) 30 MG/5ML liquid Take 2.5 mLs (15 mg total) by mouth every 8 (eight) hours as needed for cough. Qty: 89 mL, Refills: 0    loratadine (CLARITIN) 10 MG tablet Take 1 tablet (10 mg total) by mouth daily. Qty: 30 tablet, Refills: 0    ondansetron (ZOFRAN ODT) 8 MG disintegrating tablet Take 1 tablet (8 mg total) by mouth every 8 (eight) hours as needed for nausea or vomiting. Qty: 20 tablet, Refills: 0       No Known Allergies Follow-up Information    LONERGAN, MALIA A, PA-C. Schedule an appointment as soon as possible for a visit in 1 week(s).   Specialty:  Physician Assistant Contact information: [address] [place] [address] [phone]        Odis Hollingshead, MD. Schedule an appointment as soon as possible for a visit in 2 week(s).   Specialty:  General Surgery Contact information: [place]  [phone]           The results of significant diagnostics from this hospitalization (including imaging, microbiology, ancillary and laboratory) are listed below for reference.    Significant Diagnostic Studies: Dg Chest 2 View  Result Date: 02/18/2017 CLINICAL DATA:  Right upper quadrant abdominal pain. Nonproductive cough. Fever and chills. EXAM: CHEST  2 VIEW COMPARISON:  None. FINDINGS: Normal sized heart. Clear lungs. Minimal peribronchial thickening. Minimal thoracic spine degenerative changes. IMPRESSION: Minimal bronchitic changes. Electronically Signed   By: Claudie Revering M.D.   On: 02/18/2017 18:46   Nm Hepatobiliary Including Gb  Result Date: 02/19/2017 CLINICAL DATA:  RIGHT upper quadrant pain, nausea and vomiting for 3 days, cholelithiasis by ultrasound EXAM: NUCLEAR MEDICINE HEPATOBILIARY IMAGING TECHNIQUE: Sequential images of the abdomen were obtained out to 60 minutes following intravenous administration of radiopharmaceutical. RADIOPHARMACEUTICALS:  5.38 mCi Tc-101m  Choletec IV  COMPARISON:  Ultrasound abdomen 02/18/2017 FINDINGS: Normal tracer extraction from bloodstream indicating normal hepatocellular function. Prompt excretion of tracer into biliary tree. Gallbladder visualized at 49 minutes. Small bowel visualized at 11 minutes. No focal hepatic retention of tracer. Findings are consistent with patency of the common bile duct and cystic duct. IMPRESSION: Patent biliary tree without evidence of cystic duct obstruction or acute cholecystitis Electronically Signed   By: Lavonia Dana M.D.   On: 02/19/2017 12:50   US Abdomen Limited Ruq  Result Date: 02/18/2017 CLINICAL DATA:  46 y/o  F; right upper quadrant pain with vomiting. EXAM: US ABDOMEN LIMITED - RIGHT UPPER QUADRANT COMPARISON:  None. FINDINGS: Gallbladder: No gallbladder wall thickening or pericholecystic fluid. Negative sonographic Murphy's sign. Echogenic foci are present within the gallbladder compatible with small stones. Common bile duct: Diameter: 5.3 mm Liver: Diffusely increased echogenicity.  No focal lesion identified. IMPRESSION: 1. Cholelithiasis.  No secondary signs of acute cholecystitis. 2. Hepatic steatosis. Electronically Signed   By: Kristine Garbe M.D.   On: 02/18/2017 19:18   Labs: Basic Metabolic Panel:  Recent Labs Lab 02/18/17 1828  NA 140  K 3.5  CL 104  CO2 26  GLUCOSE 94  BUN 11  CREATININE 0.55  CALCIUM 9.1   Liver Function  Tests:  Recent Labs Lab 02/18/17 1828  AST 27  ALT 28  ALKPHOS 100  BILITOT 0.5  PROT 7.8  ALBUMIN 4.6    Recent Labs Lab 02/18/17 1828  LIPASE 28   CBC:  Recent Labs Lab 02/18/17 1828  WBC 7.2  HGB 13.1  HCT 39.0  MCV 86.5  PLT 155    Signed:  Barton Dubois MD.  [facility] 02/19/2017, 2:11 PM

## 2017-02-20 LAB — URINE CULTURE: Special Requests: NORMAL

## 2017-02-23 LAB — CULTURE, BLOOD (ROUTINE X 2)
Culture: NO GROWTH
SPECIAL REQUESTS: ADEQUATE

## 2017-02-27 DIAGNOSIS — K760 Fatty (change of) liver, not elsewhere classified: Secondary | ICD-10-CM | POA: Insufficient documentation

## 2017-02-27 DIAGNOSIS — R748 Abnormal levels of other serum enzymes: Secondary | ICD-10-CM | POA: Insufficient documentation

## 2017-02-28 DIAGNOSIS — R112 Nausea with vomiting, unspecified: Secondary | ICD-10-CM

## 2017-06-26 DIAGNOSIS — G44209 Tension-type headache, unspecified, not intractable: Secondary | ICD-10-CM | POA: Insufficient documentation

## 2017-06-26 DIAGNOSIS — F439 Reaction to severe stress, unspecified: Secondary | ICD-10-CM | POA: Insufficient documentation

## 2017-12-22 ENCOUNTER — Emergency Department (HOSPITAL_COMMUNITY)
Admission: EM | Admit: 2017-12-22 | Discharge: 2017-12-22 | Disposition: A | Discharge status: Home / Self Care | Payer: BLUE CROSS/BLUE SHIELD | Attending: Emergency Medicine | Admitting: Emergency Medicine

## 2017-12-22 ENCOUNTER — Other Ambulatory Visit: Payer: Self-pay

## 2017-12-22 ENCOUNTER — Encounter (HOSPITAL_COMMUNITY): Payer: Self-pay

## 2017-12-22 DIAGNOSIS — I10 Essential (primary) hypertension: Secondary | ICD-10-CM | POA: Diagnosis not present

## 2017-12-22 DIAGNOSIS — Z79899 Other long term (current) drug therapy: Secondary | ICD-10-CM | POA: Insufficient documentation

## 2017-12-22 DIAGNOSIS — R519 Headache, unspecified: Secondary | ICD-10-CM

## 2017-12-22 DIAGNOSIS — R51 Headache: Secondary | ICD-10-CM | POA: Insufficient documentation

## 2017-12-22 LAB — CBC WITH DIFFERENTIAL/PLATELET
BASOS PCT: 1 %
Basophils Absolute: 0 10*3/uL (ref 0.0–0.1)
Eosinophils Absolute: 0.4 10*3/uL (ref 0.0–0.7)
Eosinophils Relative: 5 %
HEMATOCRIT: 40.5 % (ref 36.0–46.0)
Hemoglobin: 13.2 g/dL (ref 12.0–15.0)
Lymphocytes Relative: 29 %
Lymphs Abs: 2.3 10*3/uL (ref 0.7–4.0)
MCH: 28.9 pg (ref 26.0–34.0)
MCHC: 32.6 g/dL (ref 30.0–36.0)
MCV: 88.8 fL (ref 78.0–100.0)
MONO ABS: 0.3 10*3/uL (ref 0.1–1.0)
MONOS PCT: 4 %
NEUTROS ABS: 4.7 10*3/uL (ref 1.7–7.7)
Neutrophils Relative %: 61 %
Platelets: 207 10*3/uL (ref 150–400)
RBC: 4.56 MIL/uL (ref 3.87–5.11)
RDW: 13.5 % (ref 11.5–15.5)
WBC: 7.7 10*3/uL (ref 4.0–10.5)

## 2017-12-22 LAB — COMPREHENSIVE METABOLIC PANEL
ALBUMIN: 4 g/dL (ref 3.5–5.0)
ALK PHOS: 101 U/L (ref 38–126)
ALT: 29 U/L (ref 14–54)
ANION GAP: 9 (ref 5–15)
AST: 21 U/L (ref 15–41)
BUN: 9 mg/dL (ref 6–20)
CALCIUM: 9 mg/dL (ref 8.9–10.3)
CO2: 26 mmol/L (ref 22–32)
Chloride: 104 mmol/L (ref 101–111)
Creatinine, Ser: 0.58 mg/dL (ref 0.44–1.00)
GFR calc Af Amer: >60 mL/min (ref >60)
GFR calc non Af Amer: >60 mL/min (ref >60)
GLUCOSE: 131 mg/dL — AB (ref 65–99)
Potassium: 3.8 mmol/L (ref 3.5–5.1)
SODIUM: 139 mmol/L (ref 135–145)
Total Bilirubin: 0.9 mg/dL (ref 0.3–1.2)
Total Protein: 7.2 g/dL (ref 6.5–8.1)

## 2017-12-22 MED ORDER — METOCLOPRAMIDE HCL 10 MG PO TABS
5.0000 mg | ORAL_TABLET | Freq: Once | ORAL | Status: AC
  Administered 2017-12-22: 5 mg via ORAL
  Filled 2017-12-22: qty 1

## 2017-12-22 MED ORDER — KETOROLAC TROMETHAMINE 30 MG/ML IJ SOLN
30.0000 mg | Freq: Once | INTRAMUSCULAR | Status: AC
  Administered 2017-12-22: 30 mg via INTRAMUSCULAR
  Filled 2017-12-22: qty 1

## 2017-12-22 NOTE — ED Triage Notes (Signed)
Pt endorses neck pain/headache x 1 week, Denies injury or fevers. Denies CP or shob. VSS.

## 2017-12-22 NOTE — Discharge Instructions (Signed)
Please read attached information. If you experience any new or worsening signs or symptoms please return to the emergency room for evaluation. Please follow-up with your primary care provider or specialist as discussed.  °

## 2017-12-22 NOTE — ED Provider Notes (Signed)
[facility] EMERGENCY DEPARTMENT Provider Note   CSN: [id number] Arrival date & time: 12/22/17  1205     History   Chief Complaint Chief Complaint  Patient presents with  . Headache  . Neck Pain    HPI Brittany Romero is a 47 y.o. female.  HPI patient's son is at bedside who she uses as translator  47 year old female presents today with complaints of headache.  Patient is one-week history of right-sided neck pain, headache.  She notes light sensitivity, she denies any acute neurological deficits, fever, neck stiffness, distal neurological deficits, or any red flags.  She notes she has not been using any medications for the discomfort.  She denies a significant past medical history of the same.    Past Medical History:  Diagnosis Date  . Arthritis   . GERD (gastroesophageal reflux disease)   . Hypertension     Patient Active Problem List   Diagnosis Date Noted  . Nausea   . Influenza B 02/18/2017  . Cholelithiasis 02/18/2017    Past Surgical History:  Procedure Laterality Date  . ABDOMINAL HYSTERECTOMY      OB History    No data available       Home Medications    Prior to Admission medications   Medication Sig Start Date End Date Taking? Authorizing Provider  acetaminophen (TYLENOL) 325 MG tablet Take 2 tablets (650 mg total) by mouth every 6 (six) hours as needed for mild pain or headache (or Fever >/= 101.5). 02/19/17   Barton Dubois, MD  dextromethorphan (DELSYM) 30 MG/5ML liquid Take 2.5 mLs (15 mg total) by mouth every 8 (eight) hours as needed for cough. 02/19/17   Barton Dubois, MD  loratadine (CLARITIN) 10 MG tablet Take 1 tablet (10 mg total) by mouth daily. 02/20/17   Barton Dubois, MD  ondansetron (ZOFRAN ODT) 8 MG disintegrating tablet Take 1 tablet (8 mg total) by mouth every 8 (eight) hours as needed for nausea or vomiting. 02/19/17   Barton Dubois, MD    Family History History reviewed. No pertinent family history.  Social  History Social History   Tobacco Use  . Smoking status: Never Smoker  . Smokeless tobacco: Never Used  Substance Use Topics  . Alcohol use: No  . Drug use: No     Allergies   Patient has no known allergies.   Review of Systems Review of Systems  All other systems reviewed and are negative.    Physical Exam Updated Vital Signs BP 131/79 (BP Location: Right Arm)   Pulse 82   Temp 97.9 F (36.6 C) (Oral)   Resp 18   Ht 5\' 4"  (1.626 m)   Wt 74.8 kg (165 lb)   LMP 06/23/2011   SpO2 100%   BMI 28.32 kg/m   Physical Exam  Constitutional: She is oriented to person, place, and time. She appears well-developed and well-nourished.  HENT:  Head: Normocephalic and atraumatic.  Eyes: Conjunctivae are normal. Pupils are equal, round, and reactive to light. Right eye exhibits no discharge. Left eye exhibits no discharge. No scleral icterus.  Neck: Normal range of motion. No JVD present. No tracheal deviation present.  Pulmonary/Chest: Effort normal. No stridor.  Neurological: She is alert and oriented to person, place, and time. She has normal strength. She displays a negative Romberg sign. Coordination and gait normal. GCS eye subscore is 4. GCS verbal subscore is 5. GCS motor subscore is 6.  Psychiatric: She has a normal mood and affect. Her  behavior is normal. Judgment and thought content normal.  Nursing note and vitals reviewed.    ED Treatments / Results  Labs (all labs ordered are listed, but only abnormal results are displayed) Labs Reviewed  COMPREHENSIVE METABOLIC PANEL - Abnormal; Notable for the following components:      Result Value   Glucose, Bld 131 (*)    All other components within normal limits  CBC WITH DIFFERENTIAL/PLATELET  I-STAT BETA HCG BLOOD, ED ([facility], [facility], [facility] ONLY)    EKG  EKG Interpretation None       Radiology No results found.  Procedures Procedures (including critical care time)  Medications Ordered in ED Medications  ketorolac  (TORADOL) 30 MG/ML injection 30 mg (30 mg Intramuscular Given 12/22/17 1529)  metoCLOPramide (REGLAN) tablet 5 mg (5 mg Oral Given 12/22/17 1529)     Initial Impression / Assessment and Plan / ED Course  I have reviewed the triage vital signs and the nursing notes.  Pertinent labs & imaging results that were available during my care of the patient were reviewed by me and considered in my medical decision making (see chart for details).       Final Clinical Impressions(s) / ED Diagnoses   Final diagnoses:  Acute nonintractable headache, unspecified headache type    Labs: CMP, CBC  Imaging:  Consults:  Therapeutics: Toradol, Reglan  Discharge Meds:   Assessment/Plan: 47 year old female presents today with uncomplicated headache here today.  Treated with medications with almost complete resolution.  Patient with no indication for imaging at this time.  She is given strict return precautions, she verbalized understanding and agreement to today's plan had no further questions or concerns.      ED Discharge Orders    None       Francee Gentile 12/22/17 1619    Dorie Rank, MD 12/24/17 1104

## 2018-02-18 DIAGNOSIS — Z8489 Family history of other specified conditions: Secondary | ICD-10-CM | POA: Insufficient documentation

## 2018-02-18 DIAGNOSIS — N939 Abnormal uterine and vaginal bleeding, unspecified: Secondary | ICD-10-CM | POA: Insufficient documentation

## 2019-03-10 DIAGNOSIS — R42 Dizziness and giddiness: Secondary | ICD-10-CM | POA: Insufficient documentation

## 2019-03-10 DIAGNOSIS — M545 Low back pain, unspecified: Secondary | ICD-10-CM | POA: Insufficient documentation

## 2019-12-21 DIAGNOSIS — Z833 Family history of diabetes mellitus: Secondary | ICD-10-CM | POA: Insufficient documentation

## 2021-03-12 ENCOUNTER — Encounter: Payer: Self-pay | Admitting: *Deleted

## 2021-03-13 ENCOUNTER — Encounter: Payer: Self-pay | Admitting: Internal Medicine

## 2021-03-13 ENCOUNTER — Ambulatory Visit (INDEPENDENT_AMBULATORY_CARE_PROVIDER_SITE_OTHER): Payer: BLUE CROSS/BLUE SHIELD | Admitting: Internal Medicine

## 2021-03-13 VITALS — BP 148/102 | HR 90 | Ht 64.0 in | Wt 168.1 lb

## 2021-03-13 DIAGNOSIS — K219 Gastro-esophageal reflux disease without esophagitis: Secondary | ICD-10-CM

## 2021-03-13 DIAGNOSIS — R0989 Other specified symptoms and signs involving the circulatory and respiratory systems: Secondary | ICD-10-CM

## 2021-03-13 DIAGNOSIS — Z1211 Encounter for screening for malignant neoplasm of colon: Secondary | ICD-10-CM | POA: Diagnosis not present

## 2021-03-13 DIAGNOSIS — R1010 Upper abdominal pain, unspecified: Secondary | ICD-10-CM

## 2021-03-13 DIAGNOSIS — K8020 Calculus of gallbladder without cholecystitis without obstruction: Secondary | ICD-10-CM

## 2021-03-13 DIAGNOSIS — R09A2 Foreign body sensation, throat: Secondary | ICD-10-CM

## 2021-03-13 DIAGNOSIS — R198 Other specified symptoms and signs involving the digestive system and abdomen: Secondary | ICD-10-CM

## 2021-03-13 NOTE — Progress Notes (Signed)
Patient ID: Brittany Romero, female   DOB: July 29, 1971, 50 y.o.   MRN: [id number] HPI: Brittany Romero is a 50 year old female with a history of GERD, hypertension, gallstones who is seen in consult at the request of Brantley Stage, PAC to evaluate globus sensation, abdominal pain.  She is here today with her daughter who is providing Spanish translation.  Patient does have a history of gallstones and had an abdominal ultrasound as well as HIDA scan in 2018.  She never proceeded with cholecystectomy.  She reports that she has ongoing issue with building pain in the epigastrium and right upper abdomen which radiates into her chest.  This is associated with a nausea sensation and rare vomiting.  She also reports nearly constant globus sensation.  No true heartburn and she is taking pantoprazole 40 mg once daily.  She has not found pantoprazole helps.  She feels abdominal bloating which is worse with eating meat.  The pain at times will radiate into her upper back and even right shoulder.  She does not have true dysphagia or odynophagia.  Bowel habits are generally once per day without blood or melena.  No change in bowel habit.  No frequent diarrhea or constipation.  She has never had an upper endoscopy nor colonoscopy  She has been monitoring her blood pressure and it has been elevated over the last several months.  She is on lisinopril 40 mg daily.  No chest pain or dyspnea.  No headache.  There is a family history of colon cancer in her uncle.  No first-degree relatives with colon cancer. Never a tobacco user, no alcohol intake. Single.  Past Medical History:  Diagnosis Date  . Arthritis   . Cholelithiasis   . GERD (gastroesophageal reflux disease)   . Hepatic steatosis   . Hypertension     Past Surgical History:  Procedure Laterality Date  . ABDOMINAL HYSTERECTOMY    . TUBAL LIGATION      Outpatient Medications Prior to Visit  Medication Sig Dispense Refill  . acetaminophen (TYLENOL) 325  MG tablet Take 2 tablets (650 mg total) by mouth every 6 (six) hours as needed for mild pain or headache (or Fever >/= 101.5). 40 tablet 0  . lisinopril (ZESTRIL) 40 MG tablet Take 40 mg by mouth daily.    . pantoprazole (PROTONIX) 40 MG tablet Take 1 tablet by mouth every morning.    Marland Kitchen dextromethorphan (DELSYM) 30 MG/5ML liquid Take 2.5 mLs (15 mg total) by mouth every 8 (eight) hours as needed for cough. (Patient not taking: Reported on 03/13/2021) 89 mL 0  . loratadine (CLARITIN) 10 MG tablet Take 1 tablet (10 mg total) by mouth daily. (Patient not taking: Reported on 03/13/2021) 30 tablet 0  . ondansetron (ZOFRAN ODT) 8 MG disintegrating tablet Take 1 tablet (8 mg total) by mouth every 8 (eight) hours as needed for nausea or vomiting. (Patient not taking: Reported on 03/13/2021) 20 tablet 0   No facility-administered medications prior to visit.    No Known Allergies  Family History  Problem Relation Age of Onset  . Diabetes Mother   . Diabetes Father   . Liver disease Father   . Ovarian cancer Sister   . Colon cancer Paternal Uncle   . Stomach cancer Paternal Uncle   . Esophageal cancer Neg Hx   . Pancreatic cancer Neg Hx     Social History   Tobacco Use  . Smoking status: Never Smoker  . Smokeless tobacco: Never Used  Vaping Use  .  Vaping Use: Never used  Substance Use Topics  . Alcohol use: No  . Drug use: No    ROS: As per history of present illness, otherwise negative  BP (!) 148/102 (BP Location: Left Arm, Patient Position: Sitting, Cuff Size: Normal)   Pulse 90   Ht 5\' 4"  (1.626 m)   Wt 168 lb 2 oz (76.3 kg)   LMP 06/23/2011   SpO2 98%   BMI 28.86 kg/m  Gen: awake, alert, NAD HEENT: anicteric, op clear CV: RRR, no mrg Pulm: CTA b/l Abd: soft, diffusely tender without rebound or guarding, ND, +BS throughout Ext: no c/c/e Neuro: nonfocal   RELEVANT LABS AND IMAGING: CBC    Component Value Date/Time   WBC 7.7 12/22/2017 1238   RBC 4.56 12/22/2017 1238    HGB 13.2 12/22/2017 1238   HCT 40.5 12/22/2017 1238   PLT 207 12/22/2017 1238   MCV 88.8 12/22/2017 1238   MCV 89.5 01/15/2014 1113   MCH 28.9 12/22/2017 1238   MCHC 32.6 12/22/2017 1238   RDW 13.5 12/22/2017 1238   LYMPHSABS 2.3 12/22/2017 1238   MONOABS 0.3 12/22/2017 1238   EOSABS 0.4 12/22/2017 1238   BASOSABS 0.0 12/22/2017 1238    CMP     Component Value Date/Time   NA 139 12/22/2017 1238   K 3.8 12/22/2017 1238   CL 104 12/22/2017 1238   CO2 26 12/22/2017 1238   GLUCOSE 131 (H) 12/22/2017 1238   BUN 9 12/22/2017 1238   CREATININE 0.58 12/22/2017 1238   CREATININE 0.51 01/15/2014 1111   CALCIUM 9.0 12/22/2017 1238   PROT 7.2 12/22/2017 1238   ALBUMIN 4.0 12/22/2017 1238   AST 21 12/22/2017 1238   ALT 29 12/22/2017 1238   ALKPHOS 101 12/22/2017 1238   BILITOT 0.9 12/22/2017 1238   GFRNONAA >60 12/22/2017 1238   GFRAA >60 12/22/2017 1238    ASSESSMENT/PLAN: 50 year old female with a history of GERD, hypertension, gallstones who is seen in consult at the request of Brantley Stage, PAC to evaluate globus sensation, abdominal pain  1.  Globus/upper abdominal pain/gallstones --she has globus sensation without true pyrosis symptom on pantoprazole 40 mg daily.  I feel that is very likely the gallstones explain some if not the majority of her upper abdominal symptoms but I have recommended upper endoscopy to exclude uncontrolled reflux disease, gastritis and ulcer disease. -- Continue pantoprazole 40 mg daily -- Upper endoscopy in the [place]  2.  Colon cancer screening --average risk colorectal cancer screening with colonoscopy recommended.  We reviewed the risk, benefits and alternatives to both upper endoscopy and colonoscopy and she is agreeable and wishes to proceed.  Due to a cancellation we have availability tomorrow -- Colonoscopy in the [place]  3.  Gallstones --likely symptomatic, we are proceeding as above and if unremarkable will refer her for  cholecystectomy      HB:ZJIRCVEL, Tenna Child, Pa-c [address] [place],  [place] [address]

## 2021-03-13 NOTE — Patient Instructions (Addendum)
You have been scheduled for an endoscopy and colonoscopy. Please follow the written instructions given to you at your visit today. Please pick up your prep supplies at the pharmacy within the next 1-3 days. If you use inhalers (even only as needed), please bring them with you on the day of your procedure.  Continue pantoprazole 40 mg once daily.  We have given you a work note for today.  If you are age 49 or younger, your body mass index should be between 19-25. Your Body mass index is 28.86 kg/m. If this is out of the aformentioned range listed, please consider follow up with your Primary Care Provider.   __________________________________________________________  The [place] [facility] providers would like to encourage you to use [facility] to communicate with providers for non-urgent requests or questions.  Due to long hold times on the telephone, sending your provider a message by [facility] may be a faster and more efficient way to get a response.  Please allow 48 business hours for a response.  Please remember that this is for non-urgent requests.   Due to recent changes in healthcare laws, you may see the results of your imaging and laboratory studies on MyChart before your provider has had a chance to review them.  We understand that in some cases there may be results that are confusing or concerning to you. Not all laboratory results come back in the same time frame and the provider may be waiting for multiple results in order to interpret others.  Please give [place] 48 hours in order for your provider to thoroughly review all the results before contacting the office for clarification of your results.

## 2021-03-14 ENCOUNTER — Ambulatory Visit (AMBULATORY_SURGERY_CENTER): Payer: BLUE CROSS/BLUE SHIELD | Admitting: Internal Medicine

## 2021-03-14 ENCOUNTER — Encounter: Payer: Self-pay | Admitting: Internal Medicine

## 2021-03-14 ENCOUNTER — Other Ambulatory Visit: Payer: Self-pay

## 2021-03-14 VITALS — BP 144/75 | HR 74 | Temp 98.9°F | Resp 11 | Ht 64.0 in | Wt 168.0 lb

## 2021-03-14 DIAGNOSIS — D123 Benign neoplasm of transverse colon: Secondary | ICD-10-CM

## 2021-03-14 DIAGNOSIS — D120 Benign neoplasm of cecum: Secondary | ICD-10-CM

## 2021-03-14 DIAGNOSIS — K219 Gastro-esophageal reflux disease without esophagitis: Secondary | ICD-10-CM

## 2021-03-14 DIAGNOSIS — R0989 Other specified symptoms and signs involving the circulatory and respiratory systems: Secondary | ICD-10-CM

## 2021-03-14 DIAGNOSIS — R1010 Upper abdominal pain, unspecified: Secondary | ICD-10-CM | POA: Diagnosis present

## 2021-03-14 DIAGNOSIS — Z1211 Encounter for screening for malignant neoplasm of colon: Secondary | ICD-10-CM

## 2021-03-14 MED ORDER — SODIUM CHLORIDE 0.9 % IV SOLN: 500.0000 mL | Freq: Once | INTRAVENOUS | Status: AC

## 2021-03-14 NOTE — Op Note (Signed)
[place] Patient Name: Brittany Romero Procedure Date: 03/14/2021 3:44 PM MRN: [id number] Endoscopist: Jerene Bears , MD Age: 51 Referring MD:  Date of Birth: August 07, 1971 Gender: Female Account #: [id number] Procedure:                Upper GI endoscopy Indications:              Upper abdominal pain, Globus sensation Medicines:                Monitored Anesthesia Care Procedure:                Pre-Anesthesia Assessment:                           - Prior to the procedure, a History and Physical                            was performed, and patient medications and                            allergies were reviewed. The patient's tolerance of                            previous anesthesia was also reviewed. The risks                            and benefits of the procedure and the sedation                            options and risks were discussed with the patient.                            All questions were answered, and informed consent                            was obtained. Prior Anticoagulants: The patient has                            taken no previous anticoagulant or antiplatelet                            agents. ASA Grade Assessment: II - A patient with                            mild systemic disease. After reviewing the risks                            and benefits, the patient was deemed in                            satisfactory condition to undergo the procedure.                           After obtaining informed consent, the endoscope was  passed under direct vision. Throughout the                            procedure, the patient's blood pressure, pulse, and                            oxygen saturations were monitored continuously. The                            Endoscope was introduced through the mouth, and                            advanced to the second part of duodenum. The upper                            GI endoscopy was  accomplished without difficulty.                            The patient tolerated the procedure well. Scope In: Scope Out: Findings:                 In the proximal esophagus there was a small inlet                            patch without nodularity or visible abnormality.                           The examined esophagus was normal.                           The entire examined stomach was normal. Biopsies                            were taken with a cold forceps for histology and                            Helicobacter pylori testing.                           The examined duodenum was normal. Complications:            No immediate complications. Estimated Blood Loss:     Estimated blood loss was minimal. Impression:               - Normal esophagus.                           - Normal stomach. Biopsied.                           - Normal examined duodenum. Recommendation:           - Patient has a contact number available for                            emergencies. The signs and symptoms of potential  delayed complications were discussed with the                            patient. Return to normal activities tomorrow.                            Written discharge instructions were provided to the                            patient.                           - Resume previous diet.                           - Continue present medications.                           - Await pathology results.                           - Referral to [facility] for symptomatic gallstones. Jerene Bears, MD 03/14/2021 4:13:13 PM This report has been signed electronically.

## 2021-03-14 NOTE — Progress Notes (Signed)
Called to room to assist during endoscopic procedure.  Patient ID and intended procedure confirmed with present staff. Received instructions for my participation in the procedure from the performing physician.  

## 2021-03-14 NOTE — Patient Instructions (Signed)
Information on polyps given to you today.  Await pathology results.  Resume previous diet and medications.  USTED TUVO UN PROCEDIMIENTO ENDOSCPICO HOY EN EL [facility]:   Lea el informe del procedimiento que se le entreg para cualquier pregunta especfica sobre lo que se Primary school teacher.  Si el informe del examen no responde a sus preguntas, por favor llame a su gastroenterlogo para aclararlo.  Si usted solicit que no se le den [employer] de lo que se Estate manager/land agent en su procedimiento al [employer] va a cuidar, entonces el informe del procedimiento se ha incluido en un sobre sellado para que usted lo revise despus cuando le sea ms conveniente.   LO QUE PUEDE ESPERAR: Algunas sensaciones de hinchazn en el abdomen.  Puede tener ms gases de lo normal.  El caminar puede ayudarle a eliminar el aire que se le puso en el tracto gastrointestinal durante el procedimiento y reducir la hinchazn.  Si le hicieron una endoscopia inferior (como una colonoscopia o una sigmoidoscopia flexible), podra notar manchas de sangre en las heces fecales o en el papel higinico.  Si se someti a una preparacin intestinal para su procedimiento, es posible que no tenga una evacuacin intestinal normal durante [employer].   Tenga en cuenta:  Es posible que note un poco de irritacin y congestin en la nariz o algn drenaje.  Esto es debido al oxgeno [employer] durante su procedimiento.  No hay que preocuparse y esto debe desaparecer ms o Scientist, research (medical).   SNTOMAS PARA REPORTAR INMEDIATAMENTE:  Despus de una endoscopia inferior (colonoscopia o sigmoidoscopia flexible):  Cantidades excesivas de sangre en las heces fecales  Sensibilidad significativa o empeoramiento de los dolores abdominales   Hinchazn aguda del abdomen que antes no tena   Fiebre de 100F o ms   Despus de la endoscopia superior (EGD)  Vmitos de Biochemist, clinical o material como caf molido   Dolor en el pecho o dolor debajo  de los omplatos que antes no tena   Dolor o dificultad persistente para tragar  Falta de aire que antes no tena   Fiebre de 100F o ms  Heces fecales negras y pegajosas   Para asuntos urgentes o de Freight forwarder, puede comunicarse con un gastroenterlogo a cualquier hora llamando al [phone].  DIETA:  Recomendamos una comida pequea al principio, pero luego puede continuar con su dieta normal.  Tome muchos lquidos, Teacher, adult education las bebidas alcohlicas durante 24 horas.    ACTIVIDAD:  Debe planear tomarse las cosas con calma por el resto del da y no debe CONDUCIR ni usar maquinaria pesada Programmer, applications (debido a los medicamentos de sedacin utilizados durante el examen).     SEGUIMIENTO: Nuestro personal llamar al nmero que aparece en su historial al siguiente da hbil de su procedimiento para ver cmo se siente y para responder cualquier pregunta o inquietud que pueda tener con respecto a la informacin que se le dio despus del procedimiento. Si no podemos contactarle, le dejaremos un mensaje.  Sin embargo, si se siente bien y no tiene Paediatric nurse, no es necesario que nos devuelva la llamada.  Asumiremos que ha regresado a sus actividades diarias normales sin incidentes. Si se le tomaron algunas biopsias, le contactaremos por telfono o por carta en las prximas 3 semanas.  Si no ha sabido [employer] biopsias en el transcurso de 3 semanas, por favor llmenos al [phone].   FIRMAS/CONFIDENCIALIDAD: Waldron Session y/o el acompaante que le  cuide han firmado documentos que se ingresarn en su historial mdico electrnico.  Estas firmas atestiguan el hecho de que la informacin anterior

## 2021-03-14 NOTE — Progress Notes (Signed)
PT taken to PACU. Monitors in place. VSS. Report given to RN. 

## 2021-03-14 NOTE — Op Note (Signed)
[place] Patient Name: Brittany Romero Procedure Date: 03/14/2021 3:43 PM MRN: [id number] Endoscopist: Jerene Bears , MD Age: 50 Referring MD:  Date of Birth: 03-Feb-1971 Gender: Female Account #: [id number] Procedure:                Colonoscopy Indications:              Screening for colorectal malignant neoplasm, This                            is the patient's first colonoscopy Medicines:                Monitored Anesthesia Care Procedure:                Pre-Anesthesia Assessment:                           - Prior to the procedure, a History and Physical                            was performed, and patient medications and                            allergies were reviewed. The patient's tolerance of                            previous anesthesia was also reviewed. The risks                            and benefits of the procedure and the sedation                            options and risks were discussed with the patient.                            All questions were answered, and informed consent                            was obtained. Prior Anticoagulants: The patient has                            taken no previous anticoagulant or antiplatelet                            agents. ASA Grade Assessment: II - A patient with                            mild systemic disease. After reviewing the risks                            and benefits, the patient was deemed in                            satisfactory condition to undergo the procedure.  After obtaining informed consent, the colonoscope                            was passed under direct vision. Throughout the                            procedure, the patient's blood pressure, pulse, and                            oxygen saturations were monitored continuously. The                            Olympus PCF-H190DL [id number]) Colonoscope was                            introduced through the anus and  advanced to the                            cecum, identified by appendiceal orifice and                            ileocecal valve. The colonoscopy was performed                            without difficulty. The patient tolerated the                            procedure well. The quality of the bowel                            preparation was good. The ileocecal valve,                            appendiceal orifice, and rectum were photographed. Scope In: 3:56:55 PM Scope Out: 4:09:04 PM Scope Withdrawal Time: 0 hours 10 minutes 8 seconds  Total Procedure Duration: 0 hours 12 minutes 9 seconds  Findings:                 The digital rectal exam was normal.                           A 5 mm polyp was found in the cecum. The polyp was                            sessile. The polyp was removed with a cold snare.                            Resection and retrieval were complete.                           A 3 mm polyp was found in the hepatic flexure. The                            polyp was sessile. The polyp  was removed with a                            cold snare. Resection and retrieval were complete.                           The exam was otherwise without abnormality on                            direct and retroflexion views. Complications:            No immediate complications. Estimated Blood Loss:     Estimated blood loss was minimal. Impression:               - One 5 mm polyp in the cecum, removed with a cold                            snare. Resected and retrieved.                           - One 3 mm polyp at the hepatic flexure, removed                            with a cold snare. Resected and retrieved.                           - The examination was otherwise normal on direct                            and retroflexion views. Recommendation:           - Patient has a contact number available for                            emergencies. The signs and symptoms of potential                             delayed complications were discussed with the                            patient. Return to normal activities tomorrow.                            Written discharge instructions were provided to the                            patient.                           - Resume previous diet.                           - Continue present medications.                           - Await pathology results.                           -  Repeat colonoscopy is recommended. The                            colonoscopy date will be determined after pathology                            results from today's exam become available for                            review. Jerene Bears, MD 03/14/2021 4:15:11 PM This report has been signed electronically.

## 2021-03-18 ENCOUNTER — Telehealth: Payer: Self-pay | Admitting: *Deleted

## 2021-03-18 NOTE — Telephone Encounter (Signed)
  Follow up Call-  Call back number 03/14/2021  Post procedure Call Back phone  # [phone]  Permission to leave phone message Yes  Some recent data might be hidden    Spoke with husband Patient questions:  Do you have a fever, pain , or abdominal swelling? No. Pain Score  0 *  Have you tolerated food without any problems? Yes.    Have you been able to return to your normal activities? Yes.    Do you have any questions about your discharge instructions: Diet   No. Medications  No. Follow up visit  No.  Do you have questions or concerns about your Care? No.  Actions: * If pain score is 4 or above: No action needed, pain <4.  1. Have you developed a fever since your procedure? no  2.   Have you had an respiratory symptoms (SOB or cough) since your procedure? no  3.   Have you tested positive for COVID 19 since your procedure no  4.   Have you had any family members/close contacts diagnosed with the COVID 19 since your procedure?  no   If yes to any of these questions please route to Joylene John, RN and Joella Prince, RN

## 2021-03-20 DIAGNOSIS — Z86010 Personal history of colonic polyps: Secondary | ICD-10-CM

## 2021-03-26 ENCOUNTER — Encounter: Payer: Self-pay | Admitting: Internal Medicine

## 2022-02-12 ENCOUNTER — Encounter (HOSPITAL_COMMUNITY): Payer: Self-pay | Admitting: Emergency Medicine

## 2022-02-12 ENCOUNTER — Other Ambulatory Visit: Payer: Self-pay

## 2022-02-12 ENCOUNTER — Inpatient Hospital Stay (HOSPITAL_COMMUNITY)
Admission: EM | Admit: 2022-02-12 | Discharge: 2022-02-15 | DRG: 419 | Disposition: A | Discharge status: Home / Self Care | Payer: BC Managed Care – PPO | Attending: Surgery | Admitting: Surgery

## 2022-02-12 ENCOUNTER — Emergency Department (HOSPITAL_COMMUNITY): Payer: BC Managed Care – PPO

## 2022-02-12 DIAGNOSIS — E669 Obesity, unspecified: Secondary | ICD-10-CM

## 2022-02-12 DIAGNOSIS — E781 Pure hyperglyceridemia: Secondary | ICD-10-CM | POA: Diagnosis present

## 2022-02-12 DIAGNOSIS — K644 Residual hemorrhoidal skin tags: Secondary | ICD-10-CM

## 2022-02-12 DIAGNOSIS — M1990 Unspecified osteoarthritis, unspecified site: Secondary | ICD-10-CM | POA: Diagnosis present

## 2022-02-12 DIAGNOSIS — R09A2 Foreign body sensation, throat: Secondary | ICD-10-CM

## 2022-02-12 DIAGNOSIS — Z90710 Acquired absence of both cervix and uterus: Secondary | ICD-10-CM | POA: Diagnosis not present

## 2022-02-12 DIAGNOSIS — K760 Fatty (change of) liver, not elsewhere classified: Secondary | ICD-10-CM | POA: Diagnosis present

## 2022-02-12 DIAGNOSIS — I10 Essential (primary) hypertension: Secondary | ICD-10-CM | POA: Diagnosis present

## 2022-02-12 DIAGNOSIS — K8012 Calculus of gallbladder with acute and chronic cholecystitis without obstruction: Principal | ICD-10-CM | POA: Diagnosis present

## 2022-02-12 DIAGNOSIS — Z79899 Other long term (current) drug therapy: Secondary | ICD-10-CM

## 2022-02-12 DIAGNOSIS — Z833 Family history of diabetes mellitus: Secondary | ICD-10-CM | POA: Diagnosis not present

## 2022-02-12 DIAGNOSIS — Z800 Family history of malignant neoplasm of digestive organs: Secondary | ICD-10-CM

## 2022-02-12 DIAGNOSIS — R0989 Other specified symptoms and signs involving the circulatory and respiratory systems: Secondary | ICD-10-CM

## 2022-02-12 DIAGNOSIS — K648 Other hemorrhoids: Secondary | ICD-10-CM | POA: Diagnosis present

## 2022-02-12 DIAGNOSIS — K8010 Calculus of gallbladder with chronic cholecystitis without obstruction: Secondary | ICD-10-CM

## 2022-02-12 DIAGNOSIS — Z789 Other specified health status: Secondary | ICD-10-CM

## 2022-02-12 DIAGNOSIS — K8050 Calculus of bile duct without cholangitis or cholecystitis without obstruction: Secondary | ICD-10-CM | POA: Diagnosis present

## 2022-02-12 DIAGNOSIS — K819 Cholecystitis, unspecified: Secondary | ICD-10-CM | POA: Diagnosis present

## 2022-02-12 DIAGNOSIS — Z8041 Family history of malignant neoplasm of ovary: Secondary | ICD-10-CM

## 2022-02-12 DIAGNOSIS — Z9851 Tubal ligation status: Secondary | ICD-10-CM

## 2022-02-12 DIAGNOSIS — Z86010 Personal history of colonic polyps: Secondary | ICD-10-CM

## 2022-02-12 DIAGNOSIS — E785 Hyperlipidemia, unspecified: Secondary | ICD-10-CM | POA: Diagnosis present

## 2022-02-12 DIAGNOSIS — K219 Gastro-esophageal reflux disease without esophagitis: Secondary | ICD-10-CM | POA: Diagnosis present

## 2022-02-12 DIAGNOSIS — R112 Nausea with vomiting, unspecified: Secondary | ICD-10-CM | POA: Diagnosis present

## 2022-02-12 DIAGNOSIS — K5900 Constipation, unspecified: Secondary | ICD-10-CM

## 2022-02-12 DIAGNOSIS — Z860101 Personal history of adenomatous and serrated colon polyps: Secondary | ICD-10-CM

## 2022-02-12 LAB — COMPREHENSIVE METABOLIC PANEL
ALT: 42 U/L (ref 0–44)
AST: 25 U/L (ref 15–41)
Albumin: 4.6 g/dL (ref 3.5–5.0)
Alkaline Phosphatase: 101 U/L (ref 38–126)
Anion gap: 4 — ABNORMAL LOW (ref 5–15)
BUN: 17 mg/dL (ref 6–20)
CO2: 29 mmol/L (ref 22–32)
Calcium: 9.5 mg/dL (ref 8.9–10.3)
Chloride: 107 mmol/L (ref 98–111)
Creatinine, Ser: 0.65 mg/dL (ref 0.44–1.00)
GFR, Estimated: >60 mL/min (ref >60)
Glucose, Bld: 115 mg/dL — ABNORMAL HIGH (ref 70–99)
Potassium: 3.9 mmol/L (ref 3.5–5.1)
Sodium: 140 mmol/L (ref 135–145)
Total Bilirubin: 0.6 mg/dL (ref 0.3–1.2)
Total Protein: 8.2 g/dL — ABNORMAL HIGH (ref 6.5–8.1)

## 2022-02-12 LAB — CBC
HCT: 42.5 % (ref 36.0–46.0)
Hemoglobin: 14 g/dL (ref 12.0–15.0)
MCH: 29 pg (ref 26.0–34.0)
MCHC: 32.9 g/dL (ref 30.0–36.0)
MCV: 88.2 fL (ref 80.0–100.0)
Platelets: 229 10*3/uL (ref 150–400)
RBC: 4.82 MIL/uL (ref 3.87–5.11)
RDW: 12.9 % (ref 11.5–15.5)
WBC: 10.9 10*3/uL — ABNORMAL HIGH (ref 4.0–10.5)
nRBC: 0 % (ref 0.0–0.2)

## 2022-02-12 LAB — LIPASE, BLOOD: Lipase: 36 U/L (ref 11–51)

## 2022-02-12 MED ORDER — CELECOXIB 200 MG PO CAPS
200.0000 mg | ORAL_CAPSULE | ORAL | Status: AC
  Filled 2022-02-12: qty 1

## 2022-02-12 MED ORDER — SIMETHICONE 80 MG PO CHEW
40.0000 mg | CHEWABLE_TABLET | Freq: Four times a day (QID) | ORAL | Status: DC | PRN
  Filled 2022-02-12: qty 1

## 2022-02-12 MED ORDER — ACETAMINOPHEN 500 MG PO TABS: 1000.0000 mg | ORAL_TABLET | ORAL | Status: AC

## 2022-02-12 MED ORDER — IOHEXOL 300 MG/ML  SOLN
100.0000 mL | Freq: Once | INTRAMUSCULAR | Status: AC | PRN
  Administered 2022-02-12: 100 mL via INTRAVENOUS

## 2022-02-12 MED ORDER — CHLORHEXIDINE GLUCONATE CLOTH 2 % EX PADS
6.0000 | MEDICATED_PAD | Freq: Once | CUTANEOUS | Status: AC
  Administered 2022-02-13: 6 via TOPICAL

## 2022-02-12 MED ORDER — DIPHENHYDRAMINE HCL 12.5 MG/5ML PO ELIX: 12.5000 mg | ORAL_SOLUTION | Freq: Four times a day (QID) | ORAL | Status: DC | PRN

## 2022-02-12 MED ORDER — CALCIUM POLYCARBOPHIL 625 MG PO TABS
625.0000 mg | ORAL_TABLET | Freq: Two times a day (BID) | ORAL | Status: DC
  Administered 2022-02-13 – 2022-02-15 (×4): 625 mg via ORAL
  Filled 2022-02-12 (×6): qty 1

## 2022-02-12 MED ORDER — OXYCODONE HCL 5 MG PO TABS
5.0000 mg | ORAL_TABLET | ORAL | Status: DC | PRN
  Administered 2022-02-14 (×2): 5 mg via ORAL
  Filled 2022-02-12 (×2): qty 1

## 2022-02-12 MED ORDER — ACETAMINOPHEN 325 MG PO TABS
650.0000 mg | ORAL_TABLET | Freq: Four times a day (QID) | ORAL | Status: DC | PRN
Start: 2022-02-12 — End: 2022-02-15
  Administered 2022-02-13: 650 mg via ORAL
  Filled 2022-02-12: qty 2

## 2022-02-12 MED ORDER — ONDANSETRON HCL 4 MG/2ML IJ SOLN
4.0000 mg | Freq: Once | INTRAMUSCULAR | Status: AC
  Administered 2022-02-12: 4 mg via INTRAVENOUS
  Filled 2022-02-12: qty 2

## 2022-02-12 MED ORDER — LISINOPRIL-HYDROCHLOROTHIAZIDE 20-12.5 MG PO TABS: 1.0000 | ORAL_TABLET | Freq: Every day | ORAL | Status: DC

## 2022-02-12 MED ORDER — ENSURE PRE-SURGERY PO LIQD
296.0000 mL | Freq: Once | ORAL | Status: AC
  Administered 2022-02-13: 296 mL via ORAL
  Filled 2022-02-12: qty 296

## 2022-02-12 MED ORDER — ONDANSETRON HCL 4 MG/2ML IJ SOLN
4.0000 mg | Freq: Four times a day (QID) | INTRAMUSCULAR | Status: DC | PRN
  Administered 2022-02-14 (×2): 4 mg via INTRAVENOUS
  Filled 2022-02-12: qty 2

## 2022-02-12 MED ORDER — BUPIVACAINE LIPOSOME 1.3 % IJ SUSP
20.0000 mL | INTRAMUSCULAR | Status: DC
  Filled 2022-02-12: qty 20

## 2022-02-12 MED ORDER — KETOROLAC TROMETHAMINE 15 MG/ML IJ SOLN: 15.0000 mg | Freq: Four times a day (QID) | INTRAMUSCULAR | Status: DC | PRN

## 2022-02-12 MED ORDER — ENALAPRILAT 1.25 MG/ML IV SOLN
0.6250 mg | Freq: Four times a day (QID) | INTRAVENOUS | Status: DC | PRN
  Filled 2022-02-12: qty 1

## 2022-02-12 MED ORDER — MAGIC MOUTHWASH
15.0000 mL | Freq: Four times a day (QID) | ORAL | Status: DC | PRN
  Filled 2022-02-12: qty 15

## 2022-02-12 MED ORDER — LACTATED RINGERS IV SOLN: INTRAVENOUS | Status: DC

## 2022-02-12 MED ORDER — METHOCARBAMOL 1000 MG/10ML IJ SOLN
1000.0000 mg | Freq: Four times a day (QID) | INTRAVENOUS | Status: DC | PRN
  Filled 2022-02-12: qty 10

## 2022-02-12 MED ORDER — PIPERACILLIN-TAZOBACTAM 3.375 G IVPB 30 MIN: 3.3750 g | INTRAVENOUS | Status: DC

## 2022-02-12 MED ORDER — METOPROLOL TARTRATE 5 MG/5ML IV SOLN: 5.0000 mg | Freq: Four times a day (QID) | INTRAVENOUS | Status: DC | PRN

## 2022-02-12 MED ORDER — ACETAMINOPHEN 650 MG RE SUPP: 650.0000 mg | Freq: Four times a day (QID) | RECTAL | Status: DC | PRN

## 2022-02-12 MED ORDER — PROCHLORPERAZINE EDISYLATE 10 MG/2ML IJ SOLN
5.0000 mg | Freq: Four times a day (QID) | INTRAMUSCULAR | Status: DC | PRN
  Administered 2022-02-14: 10 mg via INTRAVENOUS
  Filled 2022-02-12: qty 2

## 2022-02-12 MED ORDER — PIPERACILLIN-TAZOBACTAM 3.375 G IVPB: 3.3750 g | Freq: Three times a day (TID) | INTRAVENOUS | Status: DC

## 2022-02-12 MED ORDER — HYDROCHLOROTHIAZIDE 12.5 MG PO TABS
12.5000 mg | ORAL_TABLET | Freq: Every day | ORAL | Status: DC
Start: 2022-02-13 — End: 2022-02-15
  Administered 2022-02-15: 12.5 mg via ORAL
  Filled 2022-02-12: qty 1

## 2022-02-12 MED ORDER — METHOCARBAMOL 500 MG PO TABS: 1000.0000 mg | ORAL_TABLET | Freq: Four times a day (QID) | ORAL | Status: DC | PRN

## 2022-02-12 MED ORDER — ONDANSETRON 4 MG PO TBDP: 4.0000 mg | ORAL_TABLET | Freq: Four times a day (QID) | ORAL | Status: DC | PRN

## 2022-02-12 MED ORDER — PROCHLORPERAZINE MALEATE 10 MG PO TABS: 10.0000 mg | ORAL_TABLET | Freq: Four times a day (QID) | ORAL | Status: DC | PRN

## 2022-02-12 MED ORDER — PANTOPRAZOLE SODIUM 40 MG PO TBEC
40.0000 mg | DELAYED_RELEASE_TABLET | Freq: Two times a day (BID) | ORAL | Status: DC
  Administered 2022-02-13 – 2022-02-15 (×2): 40 mg via ORAL
  Filled 2022-02-12 (×2): qty 1

## 2022-02-12 MED ORDER — MORPHINE SULFATE (PF) 4 MG/ML IV SOLN
4.0000 mg | Freq: Once | INTRAVENOUS | Status: AC
  Administered 2022-02-12: 4 mg via INTRAVENOUS
  Filled 2022-02-12: qty 1

## 2022-02-12 MED ORDER — SODIUM CHLORIDE 0.9 % IV BOLUS
1000.0000 mL | Freq: Once | INTRAVENOUS | Status: AC
  Administered 2022-02-12: 1000 mL via INTRAVENOUS

## 2022-02-12 MED ORDER — DIPHENHYDRAMINE HCL 50 MG/ML IJ SOLN: 12.5000 mg | Freq: Four times a day (QID) | INTRAMUSCULAR | Status: DC | PRN

## 2022-02-12 MED ORDER — BISACODYL 10 MG RE SUPP
10.0000 mg | Freq: Two times a day (BID) | RECTAL | Status: DC | PRN
Start: 2022-02-12 — End: 2022-02-15

## 2022-02-12 MED ORDER — LACTATED RINGERS IV BOLUS
1000.0000 mL | Freq: Once | INTRAVENOUS | Status: AC
  Administered 2022-02-13: 1000 mL via INTRAVENOUS

## 2022-02-12 MED ORDER — SODIUM CHLORIDE 0.9 % IV SOLN
2.0000 g | INTRAVENOUS | Status: DC
  Administered 2022-02-13 – 2022-02-15 (×3): 2 g via INTRAVENOUS
  Filled 2022-02-12 (×3): qty 20

## 2022-02-12 MED ORDER — GABAPENTIN 300 MG PO CAPS: 300.0000 mg | ORAL_CAPSULE | ORAL | Status: AC

## 2022-02-12 MED ORDER — LISINOPRIL 20 MG PO TABS
20.0000 mg | ORAL_TABLET | Freq: Every day | ORAL | Status: DC
  Administered 2022-02-15: 20 mg via ORAL
  Filled 2022-02-12: qty 1

## 2022-02-12 MED ORDER — SODIUM CHLORIDE 0.9 % IV SOLN: Freq: Three times a day (TID) | INTRAVENOUS | Status: DC | PRN

## 2022-02-12 MED ORDER — LIP MEDEX EX OINT
1.0000 "application " | TOPICAL_OINTMENT | Freq: Two times a day (BID) | CUTANEOUS | Status: DC
  Administered 2022-02-13 – 2022-02-15 (×4): 1 via TOPICAL
  Filled 2022-02-12: qty 14
  Filled 2022-02-12: qty 7

## 2022-02-12 MED ORDER — HYDROMORPHONE HCL 1 MG/ML IJ SOLN
0.5000 mg | INTRAMUSCULAR | Status: DC | PRN
  Administered 2022-02-14: 0.5 mg via INTRAVENOUS
  Filled 2022-02-12: qty 1

## 2022-02-12 MED ORDER — LACTATED RINGERS IV BOLUS: 1000.0000 mL | Freq: Three times a day (TID) | INTRAVENOUS | Status: AC | PRN

## 2022-02-12 MED ORDER — ENOXAPARIN SODIUM 40 MG/0.4ML IJ SOSY
40.0000 mg | PREFILLED_SYRINGE | INTRAMUSCULAR | Status: DC
  Administered 2022-02-13 (×2): 40 mg via SUBCUTANEOUS
  Filled 2022-02-12 (×2): qty 0.4

## 2022-02-12 NOTE — ED Triage Notes (Signed)
Pt reports abd pain since 3pm. Pt reports abd swelling. Abd is distended.  ?

## 2022-02-12 NOTE — Discharge Instructions (Signed)
You have gallstones but your gallbladder does not look inflamed or infected today.  Please follow-up with general surgery, if you keep having these episodes of pain you may ultimately need to have your gallbladder removed. ? ?Your abdominal distention is likely due to constipation noted on your scan, take 1 capful of MiraLAX once daily to help you move your bowels more regularly, if you begin having diarrhea you can change this to every other day.  Please follow-up with your primary care provider. ? ?If you develop fevers, persistent or worsening abdominal pain or vomiting return for reevaluation. ? ?Tiene c?lculos biliares pero su ves?cula biliar no parece inflamada o infectada hoy. Haga un seguimiento con cirug?a general, si contin?a teniendo estos episodios de dolor, es posible que finalmente necesite que le extirpen la ves?cula biliar. ? ?Es probable que su distensi?n abdominal se deba al estre?imiento observado en su exploraci?n, tome 1 tap?n de MiraLAX una vez al d?a para ayudarlo a defecar con m?s regularidad, si comienza a tener diarrea, puede cambiarlo a d?as alternos. Por favor, haga un seguimiento con su proveedor de atenci?n primaria. ? ?Si presenta fiebre, dolor abdominal persistente o que empeora o v?mitos, regrese para una reevaluaci?n. ?

## 2022-02-12 NOTE — Progress Notes (Signed)
Pharmacy Antibiotic Note ? ?Brittany Romero is a 51 y.o. female admitted on 02/12/2022 with intra-abdominal infection.  Pharmacy has been consulted for Zosyn dosing. ? ?Plan: ?Zosyn 3.375g IV q8h (4 hour infusion). ?Need for further dosage adjustment appears unlikely at present.   ? ?Will sign off at this time.  Please reconsult if a change in clinical status warrants re-evaluation of dosage. ? ?  ? ?Temp (24hrs), Avg:98 ?F (36.7 ?C), Min:97.9 ?F (36.6 ?C), Max:98 ?F (36.7 ?C) ? ?Recent Labs  ?Lab 02/12/22 ?1739  ?WBC 10.9*  ?CREATININE 0.65  ?  ?CrCl cannot be calculated (Unknown ideal weight.).   ? ?No Known Allergies ? ?  ? ?Thank you for allowing pharmacy to be a part of this patient?s care. ? ?Everette Rank, PharmD ?02/12/2022 11:18 PM ? ?

## 2022-02-12 NOTE — ED Provider Notes (Signed)
?[facility] DEPT ?Provider Note ? ? ?CSN: [id number] ?Arrival date & time: 02/12/22  1729 ? ?  ? ?History ? ?Chief Complaint  ?Patient presents with  ? Abdominal Pain  ? ? ?Brittany Romero is a 51 y.o. female. ? ?Brittany Romero is a 51 y.o. female with a history of cholelithiasis, hepatic steatosis, GERD and hypertension, who presents to the emergency department for evaluation of abdominal pain.  Patient reports that she has been experiencing pain intermittently for the past 2 weeks, but since 3 PM today she has had pain that has been much more severe.  She reports it starts in the epigastric region and right upper quadrant and then radiates down the right side of her abdomen and into her back.  She also reports that her abdomen has felt bloated and more distended over the past few weeks.  She denies nausea or vomiting.  No constipation, diarrhea or blood in the stool.  Reports she has a history of gallstones, and this pain feels somewhat similar although worse than episode she has had before.  History of a hysterectomy but no other abdominal surgeries.  Reports she tried some over-the-counter medication for pain without relief. ? ?The history is provided by the patient and a friend. The history is limited by a language barrier. A language interpreter was used.  ? ?  ? ?Home Medications ?Prior to Admission medications   ?Medication Sig Start Date End Date Taking? Authorizing Provider  ?acetaminophen (TYLENOL) 325 MG tablet Take 2 tablets (650 mg total) by mouth every 6 (six) hours as needed for mild pain or headache (or Fever >/= 101.5). 02/19/17   Barton Dubois, MD  ?lisinopril (ZESTRIL) 40 MG tablet Take 40 mg by mouth daily. 03/01/21   [provider]  ?pantoprazole (PROTONIX) 40 MG tablet Take 1 tablet by mouth every morning. 03/01/21   [provider]  ?   ? ?Allergies    ?Patient has no known allergies.   ? ?Review of Systems   ?Review of Systems  ?Constitutional:   Negative for chills and fever.  ?HENT: Negative.    ?Respiratory:  Negative for cough and shortness of breath.   ?Cardiovascular:  Negative for chest pain.  ?Gastrointestinal:  Positive for abdominal pain. Negative for diarrhea, nausea and vomiting.  ?Genitourinary:  Negative for dysuria, flank pain and frequency.  ?Musculoskeletal:  Positive for back pain.  ?Skin:  Negative for color change and rash.  ?Neurological:  Negative for dizziness, syncope and light-headedness.  ? ?Physical Exam ?Updated Vital Signs ?BP (!) 141/89   Pulse 93   Temp 97.9 ?F (36.6 ?C) (Oral)   Resp 18   LMP 06/23/2011   SpO2 100%  ?Physical Exam ?Vitals and nursing note reviewed.  ?Constitutional:   ?   General: She is not in acute distress. ?   Appearance: Normal appearance. She is well-developed. She is not diaphoretic.  ?   Comments: Patient is alert, appears uncomfortable but is in no acute distress  ?HENT:  ?   Head: Normocephalic and atraumatic.  ?   Mouth/Throat:  ?   Mouth: Mucous membranes are moist.  ?   Pharynx: Oropharynx is clear.  ?Eyes:  ?   General:     ?   Right eye: No discharge.     ?   Left eye: No discharge.  ?Cardiovascular:  ?   Rate and Rhythm: Normal rate and regular rhythm.  ?   Pulses: Normal pulses.  ?  Heart sounds: Normal heart sounds.  ?Pulmonary:  ?   Effort: Pulmonary effort is normal. No respiratory distress.  ?   Breath sounds: Normal breath sounds. No wheezing or rales.  ?   Comments: Respirations equal and unlabored, patient able to speak in full sentences, lungs clear to auscultation bilaterally  ?Abdominal:  ?   General: Bowel sounds are normal. There is distension.  ?   Palpations: Abdomen is soft. There is no mass.  ?   Tenderness: There is abdominal tenderness. There is no guarding.  ?   Comments: Abdomen is distended but soft with bowel sounds present in all quadrants, tenderness in the epigastric region that extends across the right upper quadrant and down into the right lower quadrant as  well.  Patient has guarding in the right upper quadrant.  No CVA tenderness.  ?Musculoskeletal:     ?   General: No deformity.  ?   Cervical back: Neck supple.  ?   Comments: There is some mild tenderness across the low back without overlying skin changes or palpable deformity.  ?Skin: ?   General: Skin is warm and dry.  ?   Capillary Refill: Capillary refill takes less than 2 seconds.  ?Neurological:  ?   Mental Status: She is alert and oriented to person, place, and time.  ?   Coordination: Coordination normal.  ?   Comments: Speech is clear, able to follow commands ?Moves extremities without ataxia, coordination intact  ?Psychiatric:     ?   Mood and Affect: Mood normal.     ?   Behavior: Behavior normal.  ? ? ?ED Results / Procedures / Treatments   ?Labs ?(all labs ordered are listed, but only abnormal results are displayed) ?Labs Reviewed  ?COMPREHENSIVE METABOLIC PANEL - Abnormal; Notable for the following components:  ?    Result Value  ? Glucose, Bld 115 (*)   ? Total Protein 8.2 (*)   ? Anion gap 4 (*)   ? All other components within normal limits  ?CBC - Abnormal; Notable for the following components:  ? WBC 10.9 (*)   ? All other components within normal limits  ?LIPASE, BLOOD  ?URINALYSIS, ROUTINE W REFLEX MICROSCOPIC  ? ? ?EKG ?None ? ?Radiology ?CT ABDOMEN PELVIS W CONTRAST ? ?Result Date: 02/12/2022 ?CLINICAL DATA:  Epigastric pain, right upper quadrant pain radiating to the right lower quadrant. Abdominal distention. EXAM: CT ABDOMEN AND PELVIS WITH CONTRAST TECHNIQUE: Multidetector CT imaging of the abdomen and pelvis was performed using the standard protocol following bolus administration of intravenous contrast. RADIATION DOSE REDUCTION: This exam was performed according to the departmental dose-optimization program which includes automated exposure control, adjustment of the mA and/or kV according to patient size and/or use of iterative reconstruction technique. CONTRAST:  140m OMNIPAQUE IOHEXOL  300 MG/ML  SOLN COMPARISON:  None. FINDINGS: Lower chest: No acute abnormality Hepatobiliary: Mild diffuse low-density throughout the liver compatible with fatty infiltration. Gallbladder unremarkable. No focal hepatic abnormality. Pancreas: No focal abnormality or ductal dilatation. Spleen: No focal abnormality.  Normal size. Adrenals/Urinary Tract: No adrenal abnormality. No focal renal abnormality. No stones or hydronephrosis. Urinary bladder is unremarkable. Stomach/Bowel: Moderate stool burden in the colon. Normal appendix. Stomach, large and small bowel grossly unremarkable. Vascular/Lymphatic: No evidence of aneurysm or adenopathy. Reproductive: Uterus and adnexa unremarkable.  No mass. Other: No free fluid or free air. Musculoskeletal: No acute bony abnormality. IMPRESSION: Hepatic steatosis. Moderate stool burden throughout the colon. No acute findings in the abdomen  or pelvis. Electronically Signed   By: Rolm Baptise M.D.   On: 02/12/2022 20:20  ? ?US Abdomen Limited RUQ (LIVER/GB) ? ?Result Date: 02/12/2022 ?CLINICAL DATA:  Right upper quadrant abdominal pain EXAM: ULTRASOUND ABDOMEN LIMITED RIGHT UPPER QUADRANT COMPARISON:  CT abdomen/pelvis dated 02/12/2022 FINDINGS: Gallbladder: Minimal layering gallbladder sludge. No gallstones or gallbladder distension. Mild gallbladder wall thickening/edema. Negative sonographic Murphy's sign. Common bile duct: Diameter: 7 mm Liver: Hepatic steatosis with focal fatty sparing along the gallbladder fossa. Portal vein is patent on color Doppler imaging with normal direction of blood flow towards the liver. Other: None. IMPRESSION: Hepatic steatosis with focal fatty sparing. Mild gallbladder wall thickening/edema, but without associated sonographic findings to suggest acute cholecystitis. If there is continued clinical concern, consider hepatobiliary nuclear medicine scan. Electronically Signed   By: Julian Hy M.D.   On: 02/12/2022 21:52    ? ? ?Procedures ?Procedures  ? ? ?Medications Ordered in ED ?Medications  ?sodium chloride 0.9 % bolus 1,000 mL (1,000 mLs Intravenous New Bag/Given 02/12/22 1913)  ?ondansetron (ZOFRAN) injection 4 mg (4 mg Intravenous Given 02/12/22 19

## 2022-02-12 NOTE — ED Notes (Signed)
Patient stated she did not have to use the restroom at this time ? ?

## 2022-02-12 NOTE — H&P (Signed)
Note: ?Portions of this report may have been transcribed using voice recognition software. Every effort was made to ensure accuracy; however, inadvertent computerized transcription errors may be present.   Any transcriptional errors that result from this process are unintentional.   ? ? ? ?  ?     ? ?Carolan Shiver  ?1971-03-11 ?[id number] ? ?Patient Care Team: ?Chrystie Nose as PCP - General (Physician Assistant) ?Pyrtle, Lajuan Lines, MD as Consulting Physician (Gastroenterology) ? ?This patient is a 51 y.o.female who presents today for surgical evaluation at the request of Dr. Billy Fischer.  ? ?Reason for visit: Symptomatic gallstones ? ?Hispanic woman that speaks no [place].  Usually daughter is Optometrist.  Has had fullness and upper abdominal pain and known gallstones for more than 5 years.  Was admitted in 2018 with symptoms.  HIDA scan showed decreased gallbladder ejection fraction suspicious for symptomatic gallbladder.  However she had influenza at the time.  Plan was to place on antibiotics and do cholecystectomy when she recovered from her influenza with an outpatient set up.  That did not happen.  Had worsening abdominal complaints.  Some fullness and heartburn.  Seen by [facility] gastroenterology.  Dr. Hilarie Fredrickson did upper and lower endoscopy.  Mostly underwhelming.  1 adenomatous and 1 hyperplastic polyp.  No major gastritis or esophagitis but suspect symptomatically.  Dr. Hilarie Fredrickson suspected gallbladder the main source of her abdominal complaints.  Sent to see Dr. Redmond Pulling with our group in 2022.  He recommended cholecystectomy.  That did not happen. ? ?Patient notes more frequent attacks for the past 2 weeks with worsening discomfort.  No major fever or leukocytosis but has had persistent upper abdominal pain for many hours.  CAT scan not showing major cholecystitis but ultrasound some gallbladder wall thickening.  Some persistent discomfort.  Given recurrent attacks, surgical consultation requested. ? ?I suspect  she has chronic cholecystitis with perhaps a recent acute flare.  Given the fact that she has been lost to follow-up for elective cholecystectomy 2 times in the past 5 years and she has worsening more frequent symptoms, I think it would be wise to admit her, place her on IV antibiotics, nausea and pain control, fluid resuscitation.  Cholecystectomy this admission.  We will tentatively plan to possibly do tomorrow pending emergencies by Dr. Harlow Asa who will assume care in the morning. ? ?Full note to follow. ? ?Patient Active Problem List  ? Diagnosis Date Noted  ? Non-English speaking patient (Spanish) 02/12/2022  ? 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 External hemorrhoids with complication 18/29/9371  ? Globus sensation 02/12/2022  ? Cholecystitis 02/12/2022  ? History of adenomatous polyp of colon 03/20/2021  ? Family history of diabetes mellitus 12/21/2019  ? Dizziness 03/10/2019  ? Low back pain 03/10/2019  ? Family history of neoplasm of uncertain behavior of uterus 02/18/2018  ? Abnormal uterine bleeding 02/18/2018  ? Stress 06/26/2017  ? Tension-type headache 06/26/2017  ? Nausea and vomiting 02/28/2017  ? Elevated liver enzymes 02/27/2017  ? Steatosis of liver 02/27/2017  ? Cholelithiasis 02/18/2017  ? Gastroesophageal reflux disease without esophagitis 02/18/2017  ? Cough 02/18/2017  ? Benign essential hypertension 02/15/2016  ? Arthritis 02/15/2016  ? Dyslipidemia 02/15/2016  ? Hypertriglyceridemia 02/15/2016  ? ? ?Past Medical History:  ?Diagnosis Date  ? Arthritis   ? Cholelithiasis   ? GERD (gastroesophageal reflux disease)   ? Hepatic steatosis   ? Hypertension   ? Influenza B 02/18/2017  ? ? ?Past Surgical History:  ?Procedure Laterality Date  ?  TUBAL LIGATION    ? ? ?Social History  ? ?Socioeconomic History  ? Marital status: Single  ?  Spouse name: Not on file  ? Number of children: Not on file  ? Years of education: Not on file  ? Highest education level: Not on file  ?Occupational History  ?  Not on file  ?Tobacco Use  ? Smoking status: Never  ? Smokeless tobacco: Never  ?Vaping Use  ? Vaping Use: Never used  ?Substance and Sexual Activity  ? Alcohol use: No  ? Drug use: No  ? Sexual activity: Not on file  ?Other Topics Concern  ? Not on file  ?Social History Narrative  ? Not on file  ? ?Social Determinants of Health  ? ?Financial Resource Strain: Not on file  ?Food Insecurity: Not on file  ?Transportation Needs: Not on file  ?Physical Activity: Not on file  ?Stress: Not on file  ?Social Connections: Not on file  ?Intimate Partner Violence: Not on file  ? ? ?Family History  ?Problem Relation Age of Onset  ? Diabetes Mother   ? Diabetes Father   ? Liver disease Father   ? Ovarian cancer Sister   ? Colon cancer Paternal Uncle   ? Stomach cancer Paternal Uncle   ? Esophageal cancer Neg Hx   ? Pancreatic cancer Neg Hx   ? ? ?Current Facility-Administered Medications  ?Medication Dose Route Frequency Provider Last Rate Last Admin  ? 0.9 %  sodium chloride infusion  500 mL Intravenous Once Pyrtle, Lajuan Lines, MD      ? 0.9 %  sodium chloride infusion   Intravenous Q8H PRN Michael Boston, MD      ? acetaminophen (TYLENOL) tablet 650 mg  650 mg Oral Q6H PRN Michael Boston, MD      ? Or  ? acetaminophen (TYLENOL) suppository 650 mg  650 mg Rectal Q6H PRN Michael Boston, MD      ? Derrill Memo ON 02/13/2022] acetaminophen (TYLENOL) tablet 1,000 mg  1,000 mg Oral On Call to OR Michael Boston, MD      ? bisacodyl (DULCOLAX) suppository 10 mg  10 mg Rectal Q12H PRN Michael Boston, MD      ? bupivacaine liposome (EXPAREL) 1.3 % injection 266 mg  20 mL Infiltration Once Michael Boston, MD      ? cefTRIAXone (ROCEPHIN) 2 g in sodium chloride 0.9 % 100 mL IVPB  2 g Intravenous Q24H Michael Boston, MD      ? Derrill Memo ON 02/13/2022] celecoxib (CELEBREX) capsule 200 mg  200 mg Oral On Call to OR Michael Boston, MD      ? Chlorhexidine Gluconate Cloth 2 % PADS 6 each  6 each Topical Once Michael Boston, MD      ? And  ? Chlorhexidine Gluconate  Cloth 2 % PADS 6 each  6 each Topical Once Michael Boston, MD      ? diphenhydrAMINE (BENADRYL) 12.5 MG/5ML elixir 12.5 mg  12.5 mg Oral Q6H PRN Michael Boston, MD      ? Or  ? diphenhydrAMINE (BENADRYL) injection 12.5 mg  12.5 mg Intravenous Q6H PRN Michael Boston, MD      ? enalaprilat (VASOTEC) injection 0.625-1.25 mg  0.625-1.25 mg Intravenous Q6H PRN Michael Boston, MD      ? enoxaparin (LOVENOX) injection 40 mg  40 mg Subcutaneous Q24H Michael Boston, MD      ? Derrill Memo ON 02/13/2022] feeding supplement (ENSURE PRE-SURGERY) liquid 296 mL  296 mL Oral Once Iracema Lanagan,  Remo Lipps, MD      ? Derrill Memo ON 02/13/2022] gabapentin (NEURONTIN) capsule 300 mg  300 mg Oral On Call to OR Michael Boston, MD      ? HYDROmorphone (DILAUDID) injection 0.5-2 mg  0.5-2 mg Intravenous Q2H PRN Michael Boston, MD      ? ketorolac (TORADOL) 15 MG/ML injection 15-30 mg  15-30 mg Intravenous Q6H PRN Michael Boston, MD      ? lactated ringers bolus 1,000 mL  1,000 mL Intravenous Once Michael Boston, MD      ? lactated ringers bolus 1,000 mL  1,000 mL Intravenous Q8H PRN Michael Boston, MD      ? lactated ringers infusion   Intravenous Continuous Michael Boston, MD      ? lip balm (CARMEX) ointment 1 application.  1 application. Topical BID Michael Boston, MD      ? Derrill Memo ON 02/13/2022] lisinopril-hydrochlorothiazide (ZESTORETIC) 20-12.5 MG per tablet 1 tablet  1 tablet Oral Daily Michael Boston, MD      ? magic mouthwash  15 mL Oral QID PRN Michael Boston, MD      ? methocarbamol (ROBAXIN) 1,000 mg in dextrose 5 % 100 mL IVPB  1,000 mg Intravenous Q6H PRN Michael Boston, MD      ? methocarbamol (ROBAXIN) tablet 1,000 mg  1,000 mg Oral Q6H PRN Michael Boston, MD      ? metoprolol tartrate (LOPRESSOR) injection 5 mg  5 mg Intravenous Q6H PRN Michael Boston, MD      ? ondansetron (ZOFRAN-ODT) disintegrating tablet 4 mg  4 mg Oral Q6H PRN Michael Boston, MD      ? Or  ? ondansetron (ZOFRAN) injection 4 mg  4 mg Intravenous Q6H PRN Michael Boston, MD      ? oxyCODONE (Oxy  IR/ROXICODONE) immediate release tablet 5-10 mg  5-10 mg Oral Q4H PRN Michael Boston, MD      ? Derrill Memo ON 02/13/2022] pantoprazole (PROTONIX) EC tablet 40 mg  40 mg Oral BID Rogelia Mire, MD      ? po

## 2022-02-12 NOTE — ED Notes (Signed)
Patient is going to be admitted per MD ?

## 2022-02-12 NOTE — ED Notes (Signed)
interpreter number [phone] used for translation for assessment/ d/c instructions and education. Patient verbalized understanding of all d/c education and instructions however said she did have a questions about her eye - MD at bedside currently ?

## 2022-02-13 ENCOUNTER — Encounter (HOSPITAL_COMMUNITY): Payer: Self-pay

## 2022-02-13 ENCOUNTER — Encounter (HOSPITAL_COMMUNITY): Admission: EM | Disposition: A | Discharge status: Home / Self Care | Payer: Self-pay | Source: Home / Self Care

## 2022-02-13 LAB — HEMOGLOBIN A1C
Hgb A1c MFr Bld: 5.6 % (ref 4.8–5.6)
Mean Plasma Glucose: 114.02 mg/dL

## 2022-02-13 LAB — HIV ANTIBODY (ROUTINE TESTING W REFLEX): HIV Screen 4th Generation wRfx: NONREACTIVE

## 2022-02-13 SURGERY — LAPAROSCOPIC CHOLECYSTECTOMY WITH INTRAOPERATIVE CHOLANGIOGRAM
Anesthesia: General

## 2022-02-13 SURGICAL SUPPLY — 45 items
ADH SKN CLS APL DERMABOND .7 (GAUZE/BANDAGES/DRESSINGS)
APL PRP STRL LF DISP 70% ISPRP (MISCELLANEOUS) ×2
APPLIER CLIP ROT 10 11.4 M/L (STAPLE) ×2
APR CLP MED LRG 11.4X10 (STAPLE) ×1
BAG COUNTER SPONGE SURGICOUNT (BAG) IMPLANT
BAG RETRIEVAL 10 (BASKET) ×1
BAG SPNG CNTER NS LX DISP (BAG)
CABLE HIGH FREQUENCY MONO STRZ (ELECTRODE) ×3 IMPLANT
CHLORAPREP W/TINT 26 (MISCELLANEOUS) ×6 IMPLANT
CLIP APPLIE ROT 10 11.4 M/L (STAPLE) ×2 IMPLANT
COVER MAYO STAND XLG (MISCELLANEOUS) ×3 IMPLANT
COVER SURGICAL LIGHT HANDLE (MISCELLANEOUS) ×3 IMPLANT
DERMABOND ADVANCED (GAUZE/BANDAGES/DRESSINGS)
DERMABOND ADVANCED .7 DNX12 (GAUZE/BANDAGES/DRESSINGS) IMPLANT
DRAPE C-ARM 42X120 X-RAY (DRAPES) ×3 IMPLANT
ELECT REM PT RETURN 15FT ADLT (MISCELLANEOUS) ×3 IMPLANT
GAUZE SPONGE 2X2 8PLY STRL LF (GAUZE/BANDAGES/DRESSINGS) ×2 IMPLANT
GLOVE SURG ORTHO 8.0 STRL STRW (GLOVE) ×3 IMPLANT
GLOVE SURG SYN 7.5  E (GLOVE) ×2
GLOVE SURG SYN 7.5 E (GLOVE) ×2 IMPLANT
GLOVE SURG SYN 7.5 PF PI (GLOVE) ×2 IMPLANT
GOWN STRL REUS W/ TWL XL LVL3 (GOWN DISPOSABLE) ×4 IMPLANT
GOWN STRL REUS W/TWL XL LVL3 (GOWN DISPOSABLE) ×4
HEMOSTAT SURGICEL 4X8 (HEMOSTASIS) IMPLANT
IRRIG SUCT STRYKERFLOW 2 WTIP (MISCELLANEOUS) ×2
IRRIGATION SUCT STRKRFLW 2 WTP (MISCELLANEOUS) ×2 IMPLANT
KIT BASIN OR (CUSTOM PROCEDURE TRAY) ×3 IMPLANT
KIT TURNOVER KIT A (KITS) IMPLANT
PENCIL SMOKE EVACUATOR (MISCELLANEOUS) IMPLANT
SCISSORS LAP 5X35 DISP (ENDOMECHANICALS) ×3 IMPLANT
SET CHOLANGIOGRAPH MIX (MISCELLANEOUS) ×3 IMPLANT
SET TUBE SMOKE EVAC HIGH FLOW (TUBING) IMPLANT
SLEEVE XCEL OPT CAN 5 100 (ENDOMECHANICALS) ×3 IMPLANT
SPIKE FLUID TRANSFER (MISCELLANEOUS) ×3 IMPLANT
SPONGE GAUZE 2X2 STER 10/PKG (GAUZE/BANDAGES/DRESSINGS) ×1
STRIP CLOSURE SKIN 1/2X4 (GAUZE/BANDAGES/DRESSINGS) IMPLANT
SUT MNCRL AB 4-0 PS2 18 (SUTURE) ×3 IMPLANT
SYS BAG RETRIEVAL 10MM (BASKET) ×1
SYSTEM BAG RETRIEVAL 10MM (BASKET) ×2 IMPLANT
TOWEL OR 17X26 10 PK STRL BLUE (TOWEL DISPOSABLE) ×3 IMPLANT
TOWEL OR NON WOVEN STRL DISP B (DISPOSABLE) ×3 IMPLANT
TRAY LAPAROSCOPIC (CUSTOM PROCEDURE TRAY) ×3 IMPLANT
TROCAR BLADELESS OPT 5 100 (ENDOMECHANICALS) ×3 IMPLANT
TROCAR XCEL BLUNT TIP 100MML (ENDOMECHANICALS) ×3 IMPLANT
TROCAR XCEL NON-BLD 11X100MML (ENDOMECHANICALS) ×3 IMPLANT

## 2022-02-13 NOTE — ED Notes (Signed)
Used interpreter to discuss starting an IV and medications as patient is now staying  ?

## 2022-02-13 NOTE — Progress Notes (Signed)
The patient is injury-free, afebrile, alert, and oriented X 3. Vital signs were within the baseline during this shift. Pt NPO since 2 am for morning procedure. Pt denies chest pain, SOB, nausea, vomiting, dizziness, signs or symptoms of bleeding or infection, or acute changes during this shift. We will continue to monitor and work toward achieving the care plan goals. ?

## 2022-02-13 NOTE — Plan of Care (Signed)

## 2022-02-13 NOTE — Progress Notes (Signed)
?  Transition of Care (TOC) Screening Note ? ? ?Patient Details  ?Name: Brittany Romero ?Date of Birth: 1971/09/06 ? ? ?Transition of Care (TOC) CM/SW Contact:    ?Nykia Turko, LCSW ?Phone Number: ?02/13/2022, 1:27 PM ? ? ? ?Transition of Care Department [facility]) has reviewed patient and no TOC needs have been identified at this time. We will continue to monitor patient advancement through interdisciplinary progression rounds. If new patient transition needs arise, please place a TOC consult. ? ? ?

## 2022-02-13 NOTE — ED Provider Notes (Signed)
?  Physical Exam  ?BP (!) 146/92   Pulse 86   Temp 98 ?F (36.7 ?C)   Resp 18   LMP 06/23/2011   SpO2 100%  ? ?Physical Exam ? ?Procedures  ?Procedures ? ?ED Course / MDM  ?  ?Medical Decision Making ?Amount and/or Complexity of Data Reviewed ?Labs: ordered. ?Radiology: ordered. ? ?Risk ?Prescription drug management. ?Decision regarding hospitalization. ? ? ?51 year old Spanish-speaking female presents with concern for RUQ abdominal pain, abdominal swelling. ? ?She has had prior cholelithiasis, had an evaluation in 2018 at which point she was recommended outpatient cholecystectomy, had evaluation by gastroenterology for continued epigastric abdominal pain in 2022 for which they did an EGD which was within normal limits, with discussion that if EGD was unrevealing they would recommend follow-up with surgery for possible cholecystectomy.  She reports that she has been having intermittent abdominal pain worse after eating for years, however its become more intense and severe over the last 2 weeks.  She has associated nausea. ?CT was completed and did not show signs of bowel obstruction, did show moderate stool. ? ?Right upper quadrant ultrasound was completed which showed mild gallbladder wall thickening/edema, gallbladder sludge.  Her pain is significantly improved, however on my reexamination she continues to have specific right upper quadrant tenderness. ? ?Given GB wall thickening, slight white count, continued tenderness as well as history of multiple discussions of having cholecystectomy multiple times in the past with worsening symptoms, discussed with Dr. Johney Maine. Will admit for IV antibiotics, plan for surgery. ?  ? ? ? ? ? ?  ?Gareth Morgan, MD ?02/13/22 0100 ? ?

## 2022-02-13 NOTE — H&P (Signed)
? ? ? ?Brittany Romero ?1970-11-12  ?[id number].   ? ?Spanish interpreter was used for this visit ? ?Requesting MD: Dr. Gareth Morgan  ?Chief Complaint/Reason for Consult: Epigastric/RUQ pain ? ?HPI: Brittany Romero is a 51 y.o. female w/ a hx of gallstones, GERD and HTN who presented to the ED on 4/27 with abdominal pain.  Patient reports that for greater than 1 year she has been having epigastric/RUQ pain that radiates to her back with associated nausea and vomiting after eating.  She notes this has become more frequent over the last 2 weeks.  Usually symptoms subside on their own or after taking ibuprofen.  Her most recent episode that began yesterday has not resolved.  Denies associated fever, constipation, diarrhea, or urinary symptoms.  She has a known history of gallstones with prior ultrasounds in 2018 that showed cholelithiasis.  She was lost to follow-up.  Saw GI 5/22 and had colonoscopy and upper endoscopy that were overall reassuring. Notes she does not use NSAIDs frequently except over the last 1-2 weeks since her symptoms worsened. No alcohol or tobacco use. Was apparently supposed see Dr. Redmond Pulling after this for possible cholecystectomy but was lost to follow-up.  Work-up this hospitalization shows mildly elevated WBC at 10.9, normal lipase, normal LFTs, CT A/P with no acute findings and RUQ ultrasound with mild gallbladder wall thickening/edema with minimal layering gallbladder sludge.  She remains symptomatic this morning with moderate epigastric abdominal pain radiating to her RUQ and back. Hx of tubal ligation - no other abdominal surgeries. No blood thinners.  ? ?ROS: ?Review of Systems  ?Constitutional:  Negative for fever.  ?Gastrointestinal:  Positive for abdominal pain, nausea and vomiting. Negative for constipation and diarrhea.  ?Genitourinary: Negative.   ?Musculoskeletal:  Positive for back pain.  ?Psychiatric/Behavioral:  Negative for substance abuse.   ? ?Family History  ?Problem Relation Age  of Onset  ? Diabetes Mother   ? Diabetes Father   ? Liver disease Father   ? Ovarian cancer Sister   ? Colon cancer Paternal Uncle   ? Stomach cancer Paternal Uncle   ? Esophageal cancer Neg Hx   ? Pancreatic cancer Neg Hx   ? ? ?Past Medical History:  ?Diagnosis Date  ? Arthritis   ? Cholelithiasis   ? GERD (gastroesophageal reflux disease)   ? Hepatic steatosis   ? Hypertension   ? Influenza B 02/18/2017  ? ? ?Past Surgical History:  ?Procedure Laterality Date  ? TUBAL LIGATION    ? ? ?Social History:  reports that she has never smoked. She has never used smokeless tobacco. She reports that she does not drink alcohol and does not use drugs. ? ?Allergies: No Known Allergies ? ?Facility-Administered Medications Prior to Admission  ?Medication Dose Route Frequency Provider Last Rate Last Admin  ? 0.9 %  sodium chloride infusion  500 mL Intravenous Once Pyrtle, Lajuan Lines, MD      ? ?Medications Prior to Admission  ?Medication Sig Dispense Refill  ? ibuprofen (ADVIL) 200 MG tablet Take 200 mg by mouth every 6 (six) hours as needed for mild pain.    ? lisinopril-hydrochlorothiazide (ZESTORETIC) 20-12.5 MG tablet Take 1 tablet by mouth daily.    ? acetaminophen (TYLENOL) 325 MG tablet Take 2 tablets (650 mg total) by mouth every 6 (six) hours as needed for mild pain or headache (or Fever >/= 101.5). (Patient not taking: Reported on 02/12/2022) 40 tablet 0  ? ? ? ?Physical Exam: ?Blood pressure 128/85, pulse 77, temperature 98.1 ?  F (36.7 ?C), temperature source Oral, resp. rate 15, last menstrual period 06/23/2011, SpO2 100 %. ?General: pleasant, WD/WN female who is laying in bed in NAD ?HEENT: head is normocephalic, atraumatic.  Sclera are noninjected.  PERRL.  Ears and nose without any masses or lesions.  Mouth is pink and moist. Dentition fair ?Heart: regular, rate, and rhythm.  Palpable radial and pedal pulses bilaterally  ?Lungs: CTAB, no wheezes, rhonchi, or rales noted.  Respiratory effort nonlabored ?Abd:  Soft, ND,  epigastric and RUQ tenderness noted. +BS, no masses, hernias, or organomegaly ?MS: no BUE/BLE edema, calves soft and nontender ?Skin: warm and dry with no masses, lesions, or rashes ?Psych: A&Ox4 with an appropriate affect ?Neuro: cranial nerves grossly intact, normal speech, thought process intact, moves all extremities, gait not assessed ? ? ?Results for orders placed or performed during the hospital encounter of 02/12/22 (from the past 48 hour(s))  ?HIV Antibody (routine testing w rflx)     Status: None  ? Collection Time: 02/12/22 12:45 AM  ?Result Value Ref Range  ? HIV Screen 4th Generation wRfx Non Reactive Non Reactive  ?  Comment: Performed at [facility], [place] [address], [place], [place] [address]  ?Hemoglobin A1c     Status: None  ? Collection Time: 02/12/22 12:45 AM  ?Result Value Ref Range  ? Hgb A1c MFr Bld 5.6 4.8 - 5.6 %  ?  Comment: (NOTE) ?Pre diabetes:          5.7%-6.4% ? ?Diabetes:              >6.4% ? ?Glycemic control for   <7.0% ?adults with diabetes ?  ? Mean Plasma Glucose 114.02 mg/dL  ?  Comment: Performed at [facility], [place] [address] [place], [place] [address]  ?Lipase, blood     Status: None  ? Collection Time: 02/12/22  5:39 PM  ?Result Value Ref Range  ? Lipase 36 11 - 51 U/L  ?  Comment: Performed at [facility], [place] [address] [place], [place] [address]  ?Comprehensive metabolic panel     Status: Abnormal  ? Collection Time: 02/12/22  5:39 PM  ?Result Value Ref Range  ? Sodium 140 135 - 145 mmol/L  ? Potassium 3.9 3.5 - 5.1 mmol/L  ? Chloride 107 98 - 111 mmol/L  ? CO2 29 22 - 32 mmol/L  ? Glucose, Bld 115 (H) 70 - 99 mg/dL  ?  Comment: Glucose reference range applies only to samples taken after fasting for at least 8 hours.  ? BUN 17 6 - 20 mg/dL  ? Creatinine, Ser 0.65 0.44 - 1.00 mg/dL  ? Calcium 9.5 8.9 - 10.3 mg/dL  ? Total Protein 8.2 (H) 6.5 - 8.1 g/dL  ? Albumin 4.6 3.5 - 5.0 g/dL  ? AST 25 15 - 41 U/L  ? ALT 42 0 - 44 U/L  ? Alkaline  Phosphatase 101 38 - 126 U/L  ? Total Bilirubin 0.6 0.3 - 1.2 mg/dL  ? GFR, Estimated >60 >60 mL/min  ?  Comment: (NOTE) ?Calculated using the CKD-EPI Creatinine Equation (2021) ?  ? Anion gap 4 (L) 5 - 15  ?  Comment: Performed at [facility], [place] [address] [place], [place] [address]  ?CBC     Status: Abnormal  ? Collection Time: 02/12/22  5:39 PM  ?Result Value Ref Range  ? WBC 10.9 (H) 4.0 - 10.5 K/uL  ? RBC 4.82 3.87 - 5.11  MIL/uL  ? Hemoglobin 14.0 12.0 - 15.0 g/dL  ? HCT 42.5 36.0 - 46.0 %  ? MCV 88.2 80.0 - 100.0 fL  ? MCH 29.0 26.0 - 34.0 pg  ? MCHC 32.9 30.0 - 36.0 g/dL  ? RDW 12.9 11.5 - 15.5 %  ? Platelets 229 150 - 400 K/uL  ? nRBC 0.0 0.0 - 0.2 %  ?  Comment: Performed at [facility], [place] [address], [place], [place] [address]  ? ?CT ABDOMEN PELVIS W CONTRAST ? ?Result Date: 02/12/2022 ?CLINICAL DATA:  Epigastric pain, right upper quadrant pain radiating to the right lower quadrant. Abdominal distention. EXAM: CT ABDOMEN AND PELVIS WITH CONTRAST TECHNIQUE: Multidetector CT imaging of the abdomen and pelvis was performed using the standard protocol following bolus administration of intravenous contrast. RADIATION DOSE REDUCTION: This exam was performed according to the departmental dose-optimization program which includes automated exposure control, adjustment of the mA and/or kV according to patient size and/or use of iterative reconstruction technique. CONTRAST:  163m OMNIPAQUE IOHEXOL 300 MG/ML  SOLN COMPARISON:  None. FINDINGS: Lower chest: No acute abnormality Hepatobiliary: Mild diffuse low-density throughout the liver compatible with fatty infiltration. Gallbladder unremarkable. No focal hepatic abnormality. Pancreas: No focal abnormality or ductal dilatation. Spleen: No focal abnormality.  Normal size. Adrenals/Urinary Tract: No adrenal abnormality. No focal renal abnormality. No stones or hydronephrosis. Urinary bladder is unremarkable. Stomach/Bowel:  Moderate stool burden in the colon. Normal appendix. Stomach, large and small bowel grossly unremarkable. Vascular/Lymphatic: No evidence of aneurysm or adenopathy. Reproductive: Uterus and adnexa unremarkab

## 2022-02-13 NOTE — Progress Notes (Signed)
Pt took a bath, and CHG x 2 is complete. Pt verbalized her understanding for the procedure via remote Spanish interpreter  ?

## 2022-02-14 ENCOUNTER — Encounter (HOSPITAL_COMMUNITY): Admission: EM | Disposition: A | Discharge status: Home / Self Care | Payer: Self-pay | Source: Home / Self Care

## 2022-02-14 ENCOUNTER — Inpatient Hospital Stay (HOSPITAL_COMMUNITY): Payer: BC Managed Care – PPO | Admitting: Certified Registered Nurse Anesthetist

## 2022-02-14 ENCOUNTER — Other Ambulatory Visit: Payer: Self-pay

## 2022-02-14 ENCOUNTER — Encounter (HOSPITAL_COMMUNITY): Payer: Self-pay

## 2022-02-14 HISTORY — PX: CHOLECYSTECTOMY: SHX55

## 2022-02-14 SURGERY — LAPAROSCOPIC CHOLECYSTECTOMY
Anesthesia: General | Site: Abdomen

## 2022-02-14 MED ORDER — PHENYLEPHRINE 80 MCG/ML (10ML) SYRINGE FOR IV PUSH (FOR BLOOD PRESSURE SUPPORT)
PREFILLED_SYRINGE | INTRAVENOUS | Status: DC | PRN
Start: 2022-02-14 — End: 2022-02-14
  Administered 2022-02-14: 160 ug via INTRAVENOUS

## 2022-02-14 MED ORDER — DEXAMETHASONE SODIUM PHOSPHATE 10 MG/ML IJ SOLN
INTRAMUSCULAR | Status: DC | PRN
  Administered 2022-02-14: 10 mg via INTRAVENOUS

## 2022-02-14 MED ORDER — FENTANYL CITRATE PF 50 MCG/ML IJ SOSY
25.0000 ug | PREFILLED_SYRINGE | INTRAMUSCULAR | Status: DC | PRN
  Administered 2022-02-14: 50 ug via INTRAVENOUS

## 2022-02-14 MED ORDER — ONDANSETRON HCL 4 MG/2ML IJ SOLN
INTRAMUSCULAR | Status: AC
  Filled 2022-02-14: qty 2

## 2022-02-14 MED ORDER — ROCURONIUM BROMIDE 10 MG/ML (PF) SYRINGE
PREFILLED_SYRINGE | INTRAVENOUS | Status: DC | PRN
  Administered 2022-02-14: 50 mg via INTRAVENOUS

## 2022-02-14 MED ORDER — KETOROLAC TROMETHAMINE 15 MG/ML IJ SOLN
INTRAMUSCULAR | Status: DC | PRN
  Administered 2022-02-14: 15 mg via INTRAVENOUS

## 2022-02-14 MED ORDER — PROPOFOL 10 MG/ML IV BOLUS
INTRAVENOUS | Status: AC
  Filled 2022-02-14: qty 20

## 2022-02-14 MED ORDER — KETOROLAC TROMETHAMINE 30 MG/ML IJ SOLN
INTRAMUSCULAR | Status: AC
  Filled 2022-02-14: qty 1

## 2022-02-14 MED ORDER — EPHEDRINE 5 MG/ML INJ
INTRAVENOUS | Status: AC
  Filled 2022-02-14: qty 5

## 2022-02-14 MED ORDER — FENTANYL CITRATE (PF) 100 MCG/2ML IJ SOLN
INTRAMUSCULAR | Status: DC | PRN
  Administered 2022-02-14: 100 ug via INTRAVENOUS
  Administered 2022-02-14 (×3): 50 ug via INTRAVENOUS

## 2022-02-14 MED ORDER — SUGAMMADEX SODIUM 200 MG/2ML IV SOLN
INTRAVENOUS | Status: DC | PRN
Start: 2022-02-14 — End: 2022-02-14
  Administered 2022-02-14: 200 mg via INTRAVENOUS

## 2022-02-14 MED ORDER — BUPIVACAINE-EPINEPHRINE 0.25% -1:200000 IJ SOLN
INTRAMUSCULAR | Status: DC | PRN
  Administered 2022-02-14: 20 mL

## 2022-02-14 MED ORDER — FENTANYL CITRATE (PF) 250 MCG/5ML IJ SOLN
INTRAMUSCULAR | Status: AC
  Filled 2022-02-14: qty 5

## 2022-02-14 MED ORDER — DEXAMETHASONE SODIUM PHOSPHATE 10 MG/ML IJ SOLN
INTRAMUSCULAR | Status: AC
  Filled 2022-02-14: qty 1

## 2022-02-14 MED ORDER — FENTANYL CITRATE PF 50 MCG/ML IJ SOSY
PREFILLED_SYRINGE | INTRAMUSCULAR | Status: AC
  Filled 2022-02-14: qty 1

## 2022-02-14 MED ORDER — LACTATED RINGERS IR SOLN
Status: DC | PRN
  Administered 2022-02-14: 1000 mL

## 2022-02-14 MED ORDER — MIDAZOLAM HCL 2 MG/2ML IJ SOLN
INTRAMUSCULAR | Status: AC
  Filled 2022-02-14: qty 2

## 2022-02-14 MED ORDER — EPHEDRINE SULFATE-NACL 50-0.9 MG/10ML-% IV SOSY
PREFILLED_SYRINGE | INTRAVENOUS | Status: DC | PRN
  Administered 2022-02-14: 10 mg via INTRAVENOUS

## 2022-02-14 MED ORDER — ACETAMINOPHEN 10 MG/ML IV SOLN: 1000.0000 mg | Freq: Once | INTRAVENOUS | Status: DC | PRN

## 2022-02-14 MED ORDER — BUPIVACAINE-EPINEPHRINE (PF) 0.25% -1:200000 IJ SOLN
INTRAMUSCULAR | Status: AC
  Filled 2022-02-14: qty 30

## 2022-02-14 MED ORDER — LIDOCAINE 2% (20 MG/ML) 5 ML SYRINGE
INTRAMUSCULAR | Status: DC | PRN
  Administered 2022-02-14: 60 mg via INTRAVENOUS

## 2022-02-14 MED ORDER — PHENYLEPHRINE 80 MCG/ML (10ML) SYRINGE FOR IV PUSH (FOR BLOOD PRESSURE SUPPORT)
PREFILLED_SYRINGE | INTRAVENOUS | Status: AC
  Filled 2022-02-14: qty 10

## 2022-02-14 MED ORDER — PROPOFOL 10 MG/ML IV BOLUS
INTRAVENOUS | Status: DC | PRN
  Administered 2022-02-14: 150 mg via INTRAVENOUS

## 2022-02-14 MED ORDER — MIDAZOLAM HCL 5 MG/5ML IJ SOLN
INTRAMUSCULAR | Status: DC | PRN
  Administered 2022-02-14: 2 mg via INTRAVENOUS

## 2022-02-14 MED ORDER — OXYCODONE HCL 5 MG PO TABS: 5.0000 mg | ORAL_TABLET | Freq: Four times a day (QID) | ORAL | 0 refills | Status: DC | PRN

## 2022-02-14 SURGICAL SUPPLY — 43 items
APL SKNCLS STERI-STRIP NONHPOA (GAUZE/BANDAGES/DRESSINGS) ×1
APPLIER CLIP ROT 10 11.4 M/L (STAPLE) ×2
APR CLP MED LRG 11.4X10 (STAPLE) ×1
BAG COUNTER SPONGE SURGICOUNT (BAG) IMPLANT
BAG RETRIEVAL 10 (BASKET)
BAG SPNG CNTER NS LX DISP (BAG)
BENZOIN TINCTURE PRP APPL 2/3 (GAUZE/BANDAGES/DRESSINGS) ×3 IMPLANT
CLIP APPLIE ROT 10 11.4 M/L (STAPLE) ×2 IMPLANT
CLSR STERI-STRIP ANTIMIC 1/2X4 (GAUZE/BANDAGES/DRESSINGS) ×1 IMPLANT
COVER MAYO STAND STRL (DRAPES) ×3 IMPLANT
COVER SURGICAL LIGHT HANDLE (MISCELLANEOUS) ×3 IMPLANT
DRAPE C-ARM 42X120 X-RAY (DRAPES) ×3 IMPLANT
DRAPE UTILITY XL STRL (DRAPES) ×3 IMPLANT
DRSG TEGADERM 4X4.75 (GAUZE/BANDAGES/DRESSINGS) ×1 IMPLANT
ELECT REM PT RETURN 15FT ADLT (MISCELLANEOUS) ×3 IMPLANT
GLOVE BIO SURGEON STRL SZ7 (GLOVE) ×3 IMPLANT
GLOVE BIOGEL PI IND STRL 7.0 (GLOVE) ×2 IMPLANT
GLOVE BIOGEL PI IND STRL 7.5 (GLOVE) ×2 IMPLANT
GLOVE BIOGEL PI INDICATOR 7.0 (GLOVE) ×1
GLOVE BIOGEL PI INDICATOR 7.5 (GLOVE) ×1
GOWN STRL REUS W/ TWL LRG LVL3 (GOWN DISPOSABLE) ×4 IMPLANT
GOWN STRL REUS W/TWL LRG LVL3 (GOWN DISPOSABLE) ×4
IRRIG SUCT STRYKERFLOW 2 WTIP (MISCELLANEOUS) ×2
IRRIGATION SUCT STRKRFLW 2 WTP (MISCELLANEOUS) ×2 IMPLANT
KIT BASIN OR (CUSTOM PROCEDURE TRAY) ×3 IMPLANT
KIT TURNOVER KIT A (KITS) IMPLANT
NS IRRIG 1000ML POUR BTL (IV SOLUTION) ×3 IMPLANT
PAD POSITIONING PINK XL (MISCELLANEOUS) IMPLANT
PENCIL SMOKE EVACUATOR (MISCELLANEOUS) IMPLANT
PROTECTOR NERVE ULNAR (MISCELLANEOUS) IMPLANT
SET CHOLANGIOGRAPH MIX (MISCELLANEOUS) ×3 IMPLANT
SET TUBE SMOKE EVAC HIGH FLOW (TUBING) IMPLANT
SPIKE FLUID TRANSFER (MISCELLANEOUS) ×3 IMPLANT
SPONGE GAUZE 2X2 8PLY STRL LF (GAUZE/BANDAGES/DRESSINGS) ×1 IMPLANT
STRIP CLOSURE SKIN 1/2X4 (GAUZE/BANDAGES/DRESSINGS) ×3 IMPLANT
SUT MNCRL AB 4-0 PS2 18 (SUTURE) ×3 IMPLANT
SYS BAG RETRIEVAL 10MM (BASKET)
SYSTEM BAG RETRIEVAL 10MM (BASKET) IMPLANT
TAPE CLOTH 4X10 WHT NS (GAUZE/BANDAGES/DRESSINGS) IMPLANT
TOWEL OR 17X26 10 PK STRL BLUE (TOWEL DISPOSABLE) ×9 IMPLANT
TRAY LAPAROSCOPIC (CUSTOM PROCEDURE TRAY) ×3 IMPLANT
TROCAR XCEL BLUNT TIP 100MML (ENDOMECHANICALS) ×3 IMPLANT
TROCAR XCEL NON-BLD 11X100MML (ENDOMECHANICALS) ×3 IMPLANT

## 2022-02-14 NOTE — Progress Notes (Signed)
Pt transferred to OR at this time by night shift nurse and day tech. ?

## 2022-02-14 NOTE — Anesthesia Preprocedure Evaluation (Signed)
Anesthesia Evaluation  ?Patient identified by MRN, date of birth, ID band ?Patient awake ? ? ? ?Reviewed: ?Allergy & Precautions, NPO status , Patient's Chart, lab work & pertinent test results ? ?Airway ?Mallampati: II ? ?TM Distance: >3 FB ?Neck ROM: Full ? ? ? Dental ?no notable dental hx. ? ?  ?Pulmonary ? ?  ?Pulmonary exam normal ? ? ? ? ? ? ? Cardiovascular ?hypertension,  ?Rhythm:Regular Rate:Normal ? ? ?  ?Neuro/Psych ? Headaches, negative psych ROS  ? GI/Hepatic ?Neg liver ROS, GERD  ,Cholecystitis  ?  ?Endo/Other  ?negative endocrine ROS ? Renal/GU ?negative Renal ROS  ?negative genitourinary ?  ?Musculoskeletal ? ?(+) Arthritis , Osteoarthritis,   ? Abdominal ?Normal abdominal exam  (+)   ?Peds ? Hematology ?negative hematology ROS ?(+)   ?Anesthesia Other Findings ? ? Reproductive/Obstetrics ? ?  ? ? ? ? ? ? ? ? ? ? ? ? ? ?  ?  ? ? ? ? ? ? ? ? ?Anesthesia Physical ?Anesthesia Plan ? ?ASA: 2 ? ?Anesthesia Plan: General  ? ?Post-op Pain Management:   ? ?Induction: Intravenous ? ?PONV Risk Score and Plan: 3 and Ondansetron, Dexamethasone, Midazolam and Treatment may vary due to age or medical condition ? ?Airway Management Planned: Mask and Oral ETT ? ?Additional Equipment: None ? ?Intra-op Plan:  ? ?Post-operative Plan: Extubation in OR ? ?Informed Consent: I have reviewed the patients History and Physical, chart, labs and discussed the procedure including the risks, benefits and alternatives for the proposed anesthesia with the patient or authorized representative who has indicated his/her understanding and acceptance.  ? ? ? ?Dental advisory given and Interpreter used for interveiw ? ?Plan Discussed with: CRNA ? ?Anesthesia Plan Comments: (Lab Results ?     Component                Value               Date                 ?     WBC                      10.9 (H)            02/12/2022           ?     HGB                      14.0                02/12/2022           ?      HCT                      42.5                02/12/2022           ?     MCV                      88.2                02/12/2022           ?     PLT                      229  02/12/2022           ?Lab Results ?     Component                Value               Date                 ?     NA                       140                 02/12/2022           ?     K                        3.9                 02/12/2022           ?     CO2                      29                  02/12/2022           ?     GLUCOSE                  115 (H)             02/12/2022           ?     BUN                      17                  02/12/2022           ?     CREATININE               0.65                02/12/2022           ?     CALCIUM                  9.5                 02/12/2022           ?     GFRNONAA                 >60                 02/12/2022          )  ? ? ? ? ? ? ?Anesthesia Quick Evaluation ? ?

## 2022-02-14 NOTE — Anesthesia Procedure Notes (Signed)
Procedure Name: Intubation ?Date/Time: 02/14/2022 8:10 AM ?Performed by: Gean Maidens, CRNA ?Pre-anesthesia Checklist: Patient identified, Emergency Drugs available, Suction available, Patient being monitored and Timeout performed ?Patient Re-evaluated:Patient Re-evaluated prior to induction ?Oxygen Delivery Method: Circle system utilized ?Preoxygenation: Pre-oxygenation with 100% oxygen ?Induction Type: IV induction ?Ventilation: Mask ventilation without difficulty ?Laryngoscope Size: Mac and 4 ?Grade View: Grade I ?Tube type: Oral ?Tube size: 7.0 mm ?Number of attempts: 1 ?Airway Equipment and Method: Stylet ?Placement Confirmation: ETT inserted through vocal cords under direct vision, positive ETCO2 and breath sounds checked- equal and bilateral ?Secured at: 21 cm ?Tube secured with: Tape ?Dental Injury: Teeth and Oropharynx as per pre-operative assessment  ? ? ? ? ?

## 2022-02-14 NOTE — Progress Notes (Signed)
OR has called for pt, brief report provided  ?

## 2022-02-14 NOTE — Transfer of Care (Signed)
Immediate Anesthesia Transfer of Care Note ? ?Patient: Brittany Romero ? ?Procedure(s) Performed: LAPAROSCOPIC CHOLECYSTECTOMY (Abdomen) ? ?Patient Location: PACU ? ?Anesthesia Type:General ? ?Level of Consciousness: sedated, patient cooperative and responds to stimulation ? ?Airway & Oxygen Therapy: Patient Spontanous Breathing and Patient connected to face mask oxygen ? ?Post-op Assessment: Report given to RN and Post -op Vital signs reviewed and stable ? ?Post vital signs: Reviewed and stable ? ?Last Vitals:  ?Vitals Value Taken Time  ?BP 148/83 02/14/22 0907  ?Temp    ?Pulse 73 02/14/22 0908  ?Resp 27 02/14/22 0908  ?SpO2 99 % 02/14/22 0908  ?Vitals shown include unvalidated device data. ? ?Last Pain:  ?Vitals:  ? 02/14/22 0424  ?TempSrc: Oral  ?PainSc:   ?   ? ?  ? ?Complications: No notable events documented. ?

## 2022-02-14 NOTE — Op Note (Signed)
Laparoscopic Cholecystectomy Procedure Note ? ?Indications: This patient presents with acute cholecystitis and will undergo laparoscopic cholecystectomy. ? ?Pre-operative Diagnosis: Calculus of gallbladder with acute cholecystitis, without mention of obstruction ? ?Post-operative Diagnosis: Same ? ?Surgeon: Maia Petties  ? ?Assistants: none ? ?Anesthesia: General endotracheal anesthesia ? ?ASA Class: 2 ? ?Procedure Details  ?The patient was seen again in the Holding Room. The risks, benefits, complications, treatment options, and expected outcomes were discussed with the patient. The possibilities of reaction to medication, pulmonary aspiration, perforation of viscus, bleeding, recurrent infection, finding a normal gallbladder, the need for additional procedures, failure to diagnose a condition, the possible need to convert to an open procedure, and creating a complication requiring transfusion or operation were discussed with the patient. The likelihood of improving the patient's symptoms with return to their baseline status is good.  The patient and/or family concurred with the proposed plan, giving informed consent. The site of surgery properly noted. The patient was taken to Operating Room, identified as Brittany Romero and the procedure verified as Laparoscopic Cholecystectomy with Intraoperative Cholangiogram. A Time Out was held and the above information confirmed. ? ?Prior to the induction of general anesthesia, antibiotic prophylaxis was administered. General endotracheal anesthesia was then administered and tolerated well. After the induction, the abdomen was prepped with Chloraprep and draped in sterile fashion. The patient was positioned in the supine position. ? ?Local anesthetic agent was injected into the skin near the umbilicus and an incision made. We dissected down to the abdominal fascia with blunt dissection.  The fascia was incised vertically and we entered the peritoneal cavity bluntly.  A  pursestring suture of 0-Vicryl was placed around the fascial opening.  The Hasson cannula was inserted and secured with the stay suture.  Pneumoperitoneum was then created with CO2 and tolerated well without any adverse changes in the patient's vital signs. An 11-mm port was placed in the subxiphoid position.  Two 5-mm ports were placed in the right upper quadrant. All skin incisions were infiltrated with a local anesthetic agent before making the incision and placing the trocars.  ? ?We positioned the patient in reverse Trendelenburg, tilted slightly to the patient's left.  The gallbladder was identified, the fundus grasped and retracted cephalad. The gallbladder is quite edematous and distended, but there are no adhesions to the gallbladder.  The infundibulum was grasped and retracted laterally, exposing the peritoneum overlying the triangle of Calot. This was then divided and exposed in a blunt fashion. The cystic duct was clearly identified and bluntly dissected circumferentially. A critical view of the cystic duct and cystic artery was obtained.  The cystic duct was then ligated with clips and divided. The cystic artery was, dissected free, ligated with clips and divided as well.  ? ?The gallbladder was dissected from the liver bed in retrograde fashion with the electrocautery. The gallbladder was removed and placed in a retrieval sac. The liver bed was irrigated and inspected. Hemostasis was achieved with the electrocautery. Copious irrigation was utilized and was repeatedly aspirated until clear.  The gallbladder and retrieval sac were then removed through the umbilical port site.  The pursestring suture was used to close the umbilical fascia.   ? ?We again inspected the right upper quadrant for hemostasis.  Pneumoperitoneum was released as we removed the trocars.  4-0 Monocryl was used to close the skin.   Benzoin, steri-strips, and clean dressings were applied. The patient was then extubated and brought to  the recovery room in stable condition. Instrument,  sponge, and needle counts were correct at closure and at the conclusion of the case.  ? ?Findings: ?Cholecystitis with Cholelithiasis ? ?Estimated Blood Loss: Minimal ?        ?Drains: none ?        ?Specimens: Gallbladder     ?      ?Complications: None; patient tolerated the procedure well. ?        ?Disposition: PACU - hemodynamically stable. ?        ?Condition: stable ? ?Imogene Burn. Gavriella Hearst, MD, FACS ?[facility]  ?General Surgery ? ? ?02/14/2022 ?8:55 AM ? ? ? ? ?

## 2022-02-14 NOTE — Progress Notes (Signed)
Pt has returned from OR. ?Family in room.VS WNL. ?

## 2022-02-14 NOTE — Progress Notes (Signed)
Patient ID: Brittany Romero, female   DOB: 1971-06-19, 51 y.o.   MRN: [id number] ? ?Unable to perform surgery yesterday due to scheduling issues. ?Patient still symptomatic ?Will proceed with laparoscopic cholecystectomy this morning. ? ?Questions answered via interpreter. ? ?Brittany Romero. Brittany Gaba, MD, FACS ?[facility]  ?General Surgery ? ? ?02/14/2022 ?7:30 AM ? ?

## 2022-02-14 NOTE — Anesthesia Postprocedure Evaluation (Signed)
Anesthesia Post Note ? ?Patient: Brittany Romero ? ?Procedure(s) Performed: LAPAROSCOPIC CHOLECYSTECTOMY (Abdomen) ? ?  ? ?Patient location during evaluation: PACU ?Anesthesia Type: General ?Level of consciousness: awake and alert ?Pain management: pain level controlled ?Vital Signs Assessment: post-procedure vital signs reviewed and stable ?Respiratory status: spontaneous breathing, nonlabored ventilation, respiratory function stable and patient connected to nasal cannula oxygen ?Cardiovascular status: blood pressure returned to baseline and stable ?Postop Assessment: no apparent nausea or vomiting ?Anesthetic complications: no ? ? ?No notable events documented. ? ?Last Vitals:  ?Vitals:  ? 02/14/22 1020 02/14/22 1208  ?BP: 127/78 127/83  ?Pulse: 81 82  ?Resp: 18 20  ?Temp: 36.8 ?C 36.8 ?C  ?SpO2: 100% 100%  ?  ?Last Pain:  ?Vitals:  ? 02/14/22 1208  ?TempSrc: Oral  ?PainSc:   ? ? ?  ?  ?  ?  ?  ?  ? ?Brittany Romero ? ? ? ? ?

## 2022-02-15 ENCOUNTER — Encounter (HOSPITAL_COMMUNITY): Payer: Self-pay | Admitting: Surgery

## 2022-02-15 NOTE — Progress Notes (Signed)
Pt leaving this afternoon with her daughter. ?Alert and oriented; without c/o. ?Discharge instructions given/explained with pt and her daughter verbalizing understanding. ?Pt and daughter aware of followup. ?

## 2022-02-15 NOTE — Discharge Summary (Signed)
Physician Discharge Summary  ?Patient ID: ?Brittany Romero ?MRN: [id number] ?DOB/AGE: [birth year] 51 y.o. ? ?Admit date: 02/12/2022 ?Discharge date: 02/15/2022 ? ?Admission Diagnoses:  Acute calculous cholecystitis ? ?Discharge Diagnoses: Same ?Principal Problem: ?  Chronic calculous cholecystitis ?Active Problems: ?  Nausea and vomiting ?  Non-English speaking patient (Spanish) ?  History of adenomatous polyp of colon ?  External hemorrhoids with complication ?  Globus sensation ?  Benign essential hypertension ?  Gastroesophageal reflux disease without esophagitis ?  Cholecystitis ?  Obesity (BMI 30-39.9) ? ? ?Discharged Condition: good ? ?Hospital Course: Admitted 02/13/22 for acute cholecystitis.  Laparoscopic cholecystectomy 02/14/22.  She had significant post-op nausea and vomiting, but much improved today.  Ready for discharge.  Tolerating diet.  No nausea ? ?Treatments: surgery: laparoscopic cholecystectomy 02/14/22 ? ?Discharge Exam: ?Blood pressure 140/79, pulse 76, temperature 98.3 ?F (36.8 ?C), temperature source Oral, resp. rate 16, last menstrual period 06/23/2011, SpO2 99 %. ?General appearance: alert, cooperative, and no distress ?GI: soft, mild tenderness, incisions c/d/i ? ?Disposition: Discharge disposition: 01-Home or Self Care ? ? ? ? ? ? ?Discharge Instructions   ? ? Call MD for:  persistant nausea and vomiting   Complete by: As directed ?  ? Call MD for:  redness, tenderness, or signs of infection (pain, swelling, redness, odor or green/yellow discharge around incision site)   Complete by: As directed ?  ? Call MD for:  severe uncontrolled pain   Complete by: As directed ?  ? Call MD for:  temperature >100.4   Complete by: As directed ?  ? Diet general   Complete by: As directed ?  ? Driving Restrictions   Complete by: As directed ?  ? Do not drive while taking pain medications  ? Increase activity slowly   Complete by: As directed ?  ? May shower / Bathe   Complete by: As directed ?  ? ?  ? ?Allergies  as of 02/15/2022   ?No Known Allergies ?  ? ?  ?Medication List  ?  ? ?TAKE these medications   ? ?acetaminophen 325 MG tablet ?Commonly known as: TYLENOL ?Take 2 tablets (650 mg total) by mouth every 6 (six) hours as needed for mild pain or headache (or Fever >/= 101.5). ?  ?ibuprofen 200 MG tablet ?Commonly known as: ADVIL ?Take 200 mg by mouth every 6 (six) hours as needed for mild pain. ?  ?lisinopril-hydrochlorothiazide 20-12.5 MG tablet ?Commonly known as: ZESTORETIC ?Take 1 tablet by mouth daily. ?  ?oxyCODONE 5 MG immediate release tablet ?Commonly known as: Oxy IR/ROXICODONE ?Take 1 tablet (5 mg total) by mouth every 6 (six) hours as needed for moderate pain, severe pain or breakthrough pain. ?  ? ?  ? ? Follow-up Information   ? ? Brantley Stage A, PA-C .   ?Specialty: Physician Assistant ?Contact information: ?[address] ?[place] [address] ?[phone] ? ? ?  ?  ? ? [facility], [place] .   ?Specialty: General Surgery ?Contact information: ?[place] ?[address] ?[place] [address] ?[phone] ? ? ?  ?  ? ?  ?  ? ?  ? ? ?Signed: ?Imogene Burn Saharra Santo ?02/15/2022, 8:22 AM ? ? ?

## 2022-02-17 LAB — SURGICAL PATHOLOGY

## 2022-10-17 IMAGING — CT CT ABD-PELV W/ CM
3 of 5 series · 17 of 46 positions shown, 19 images · IV contrast (agent unspecified)
Comparison: None.

CLINICAL DATA: Epigastric pain, right upper quadrant pain radiating
to the right lower quadrant. Abdominal distention.

EXAM:
CT ABDOMEN AND PELVIS WITH CONTRAST
TECHNIQUE: Multidetector CT imaging of the abdomen and pelvis was performed
using the standard protocol following bolus administration of
intravenous contrast.

[Series 2: axial st · axial · 0.77mm/px · z∈[+1038,+1444]mm · 12 of 97 slices shown, 14 images]
[im 8/97  soft-tissue]
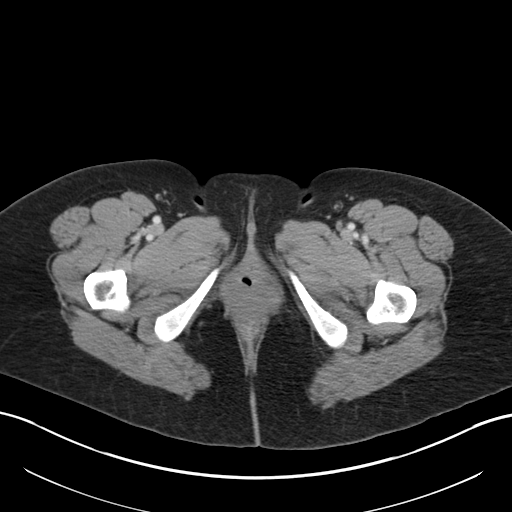
[im 8/97  bone]
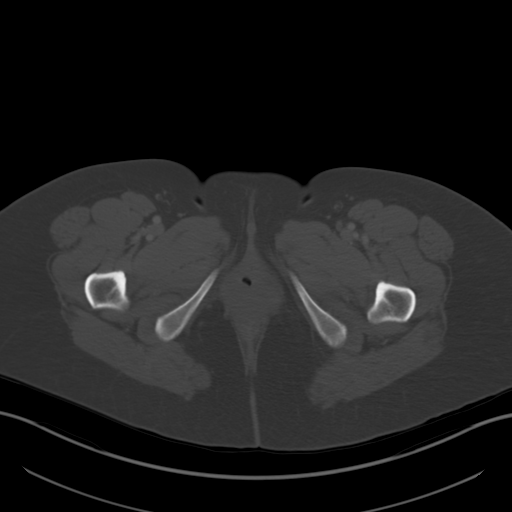
[im 15/97  soft-tissue]
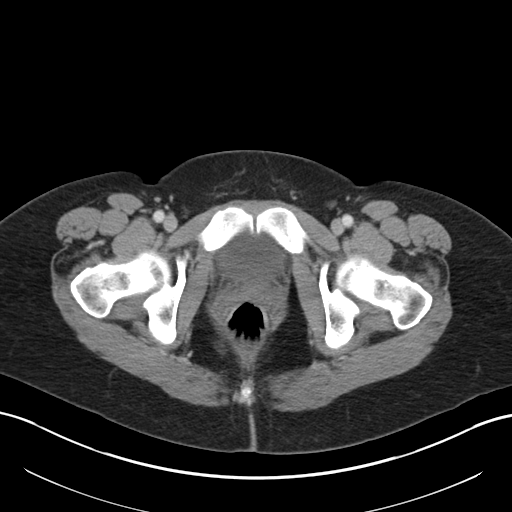
[im 23/97  soft-tissue]
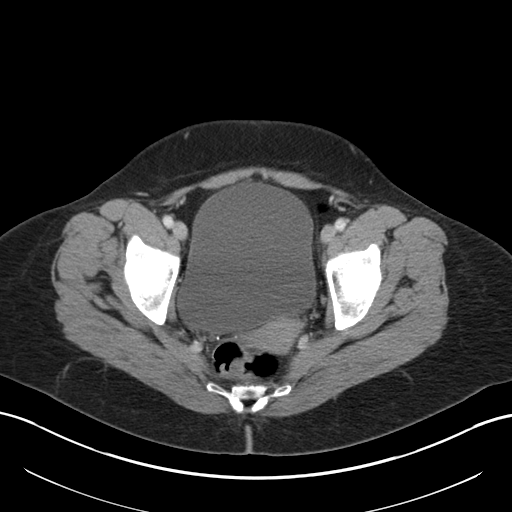
[im 30/97  soft-tissue]
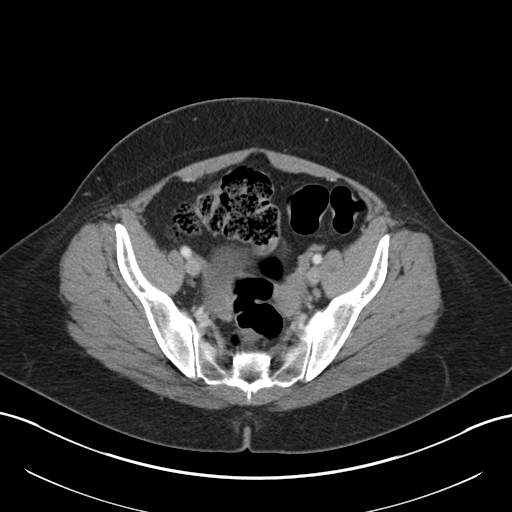
[im 37/97  soft-tissue]
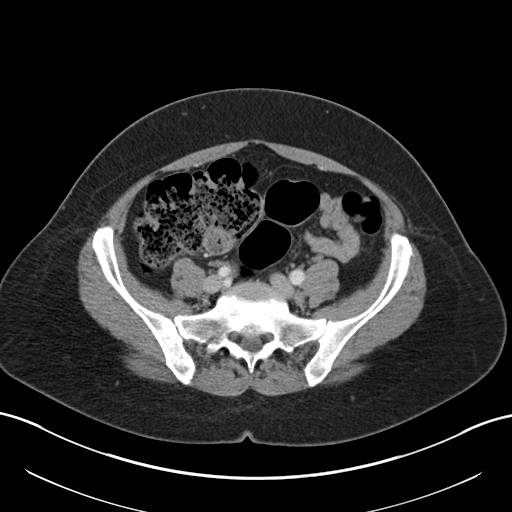
[im 45/97  soft-tissue]
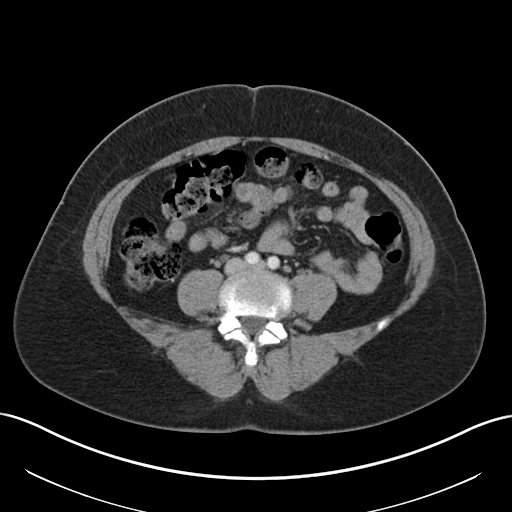
[im 52/97  soft-tissue]
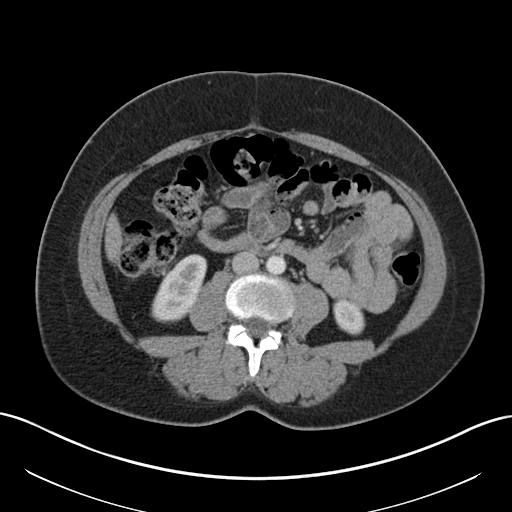
[im 60/97  soft-tissue]
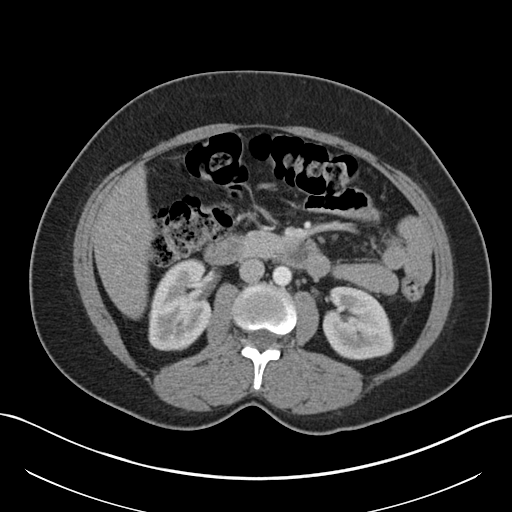
[im 67/97  soft-tissue]
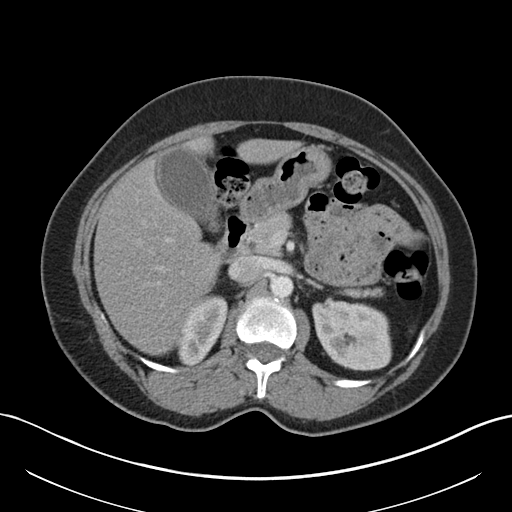
[im 67/97  bone]
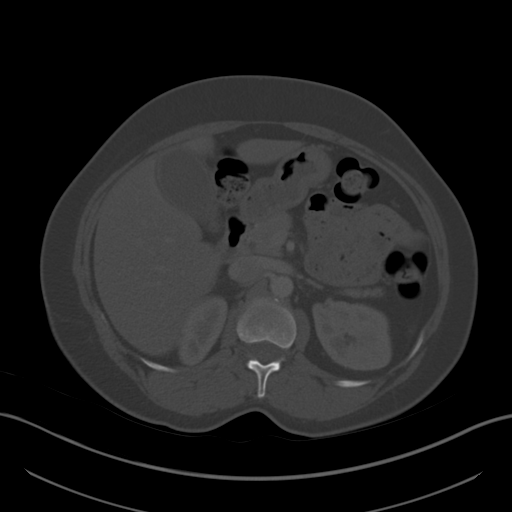
[im 74/97  soft-tissue]
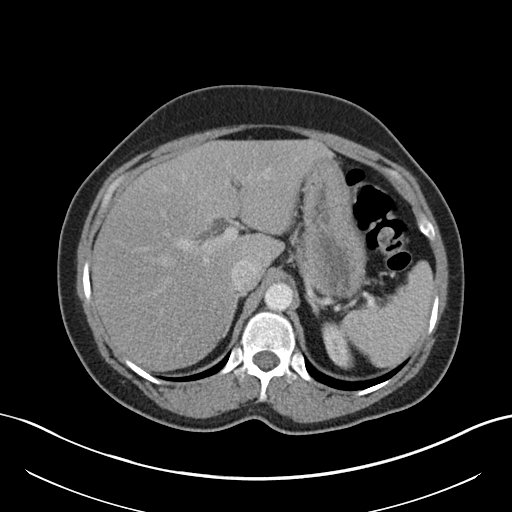
[im 82/97  soft-tissue]
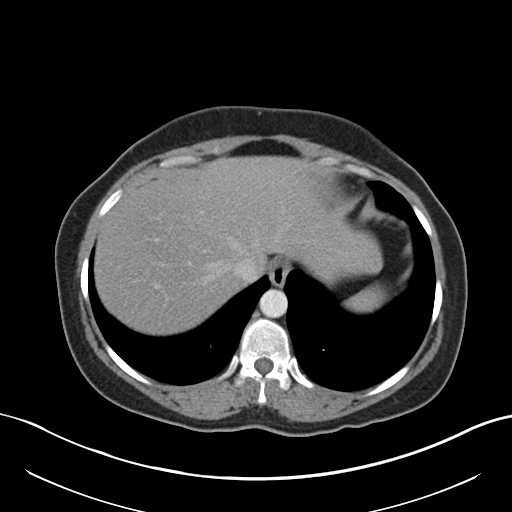
[im 89/97  soft-tissue]
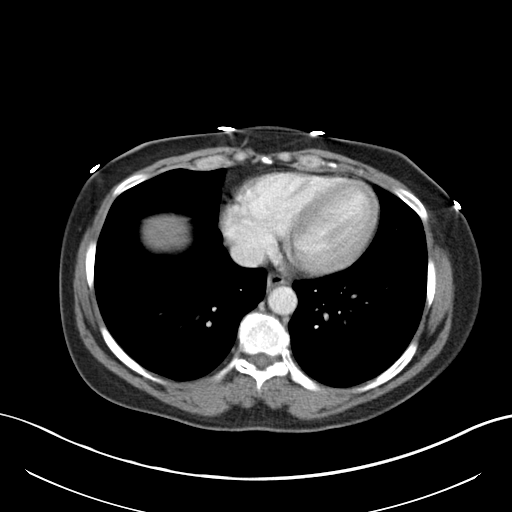

[Series 4: coronal st · coronal · 0.92mm/px · 3 of 137 slices shown]
[im 46/137  soft-tissue]
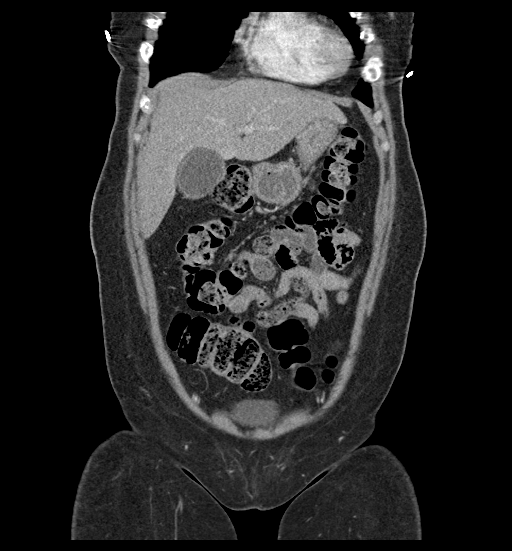
[im 61/137  soft-tissue]
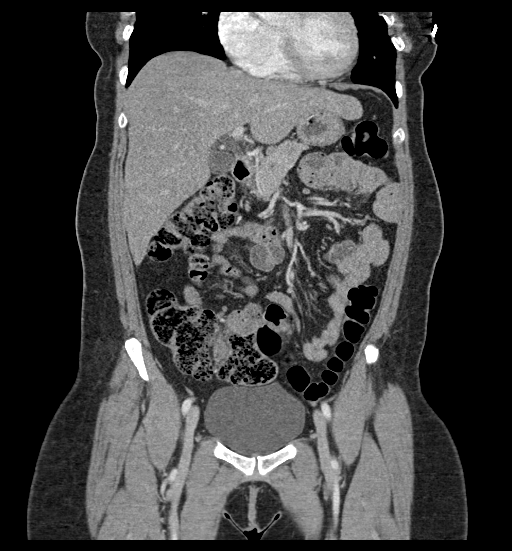
[im 76/137  soft-tissue]
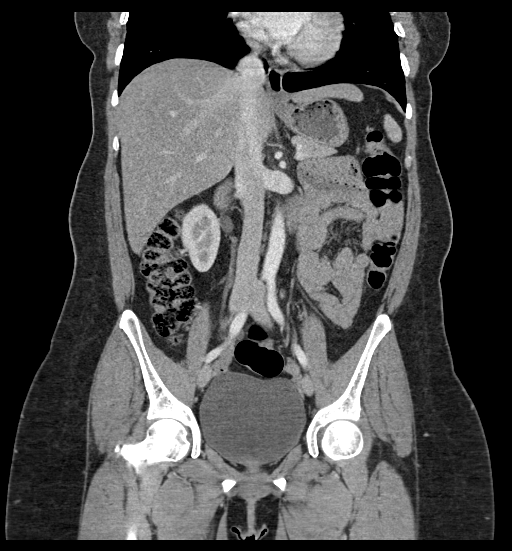

[Series 6: lung bases · axial · 0.77mm/px · z∈[+1324,+1352]mm · 2 of 88 slices shown]
[im 8/88  bone]
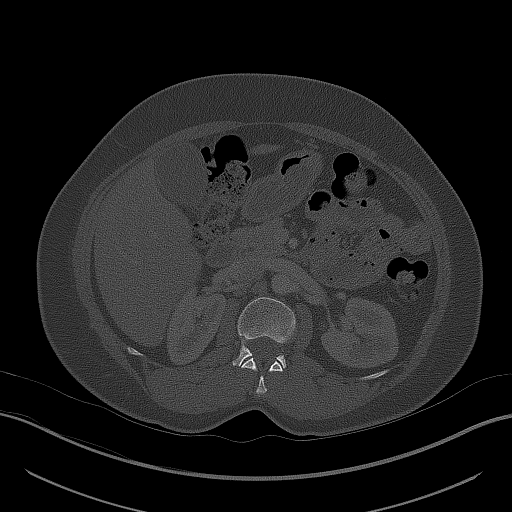
[im 22/88  bone]
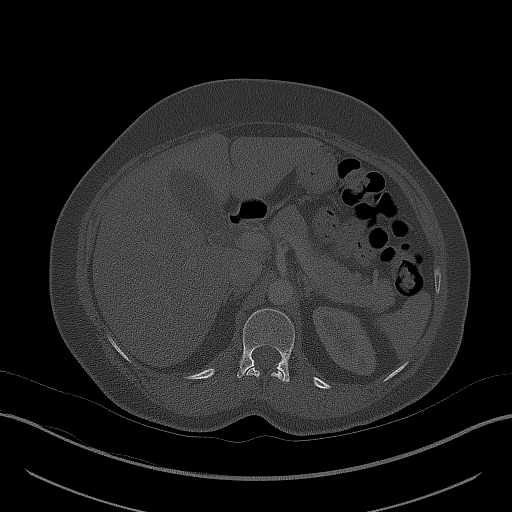

[17 of 46 positions shown; findings below may reference images not displayed]

RADIATION DOSE REDUCTION: This exam was performed according to the
departmental dose-optimization program which includes automated
exposure control, adjustment of the mA and/or kV according to
patient size and/or use of iterative reconstruction technique.

CONTRAST:  100mL OMNIPAQUE IOHEXOL 300 MG/ML  SOLN
FINDINGS: Lower chest: No acute abnormality

Hepatobiliary: Mild diffuse low-density throughout the liver
compatible with fatty infiltration. Gallbladder unremarkable. No
focal hepatic abnormality.

Pancreas: No focal abnormality or ductal dilatation.

Spleen: No focal abnormality.  Normal size.

Adrenals/Urinary Tract: No adrenal abnormality. No focal renal
abnormality. No stones or hydronephrosis. Urinary bladder is
unremarkable.

Stomach/Bowel: Moderate stool burden in the colon. Normal appendix.
Stomach, large and small bowel grossly unremarkable.

Vascular/Lymphatic: No evidence of aneurysm or adenopathy.

Reproductive: Uterus and adnexa unremarkable.  No mass.

Other: No free fluid or free air.

Musculoskeletal: No acute bony abnormality.
IMPRESSION: Hepatic steatosis.

Moderate stool burden throughout the colon.

No acute findings in the abdomen or pelvis.

## 2023-06-24 ENCOUNTER — Emergency Department (HOSPITAL_COMMUNITY): Payer: BC Managed Care – PPO

## 2023-06-24 ENCOUNTER — Emergency Department (HOSPITAL_COMMUNITY)
Admission: EM | Admit: 2023-06-24 | Discharge: 2023-06-24 | Disposition: A | Discharge status: Home / Self Care | Payer: BC Managed Care – PPO | Attending: Emergency Medicine | Admitting: Emergency Medicine

## 2023-06-24 ENCOUNTER — Other Ambulatory Visit: Payer: Self-pay

## 2023-06-24 ENCOUNTER — Encounter (HOSPITAL_COMMUNITY): Payer: Self-pay | Admitting: Emergency Medicine

## 2023-06-24 DIAGNOSIS — I10 Essential (primary) hypertension: Secondary | ICD-10-CM | POA: Insufficient documentation

## 2023-06-24 DIAGNOSIS — R079 Chest pain, unspecified: Secondary | ICD-10-CM | POA: Diagnosis present

## 2023-06-24 DIAGNOSIS — R0789 Other chest pain: Secondary | ICD-10-CM | POA: Diagnosis not present

## 2023-06-24 LAB — CBC
HCT: 38.1 % (ref 36.0–46.0)
Hemoglobin: 12.5 g/dL (ref 12.0–15.0)
MCH: 28.7 pg (ref 26.0–34.0)
MCHC: 32.8 g/dL (ref 30.0–36.0)
MCV: 87.4 fL (ref 80.0–100.0)
Platelets: 198 10*3/uL (ref 150–400)
RBC: 4.36 MIL/uL (ref 3.87–5.11)
RDW: 12.9 % (ref 11.5–15.5)
WBC: 7.7 10*3/uL (ref 4.0–10.5)
nRBC: 0 % (ref 0.0–0.2)

## 2023-06-24 LAB — BASIC METABOLIC PANEL
Anion gap: 13 (ref 5–15)
BUN: 23 mg/dL — ABNORMAL HIGH (ref 6–20)
CO2: 22 mmol/L (ref 22–32)
Calcium: 9.2 mg/dL (ref 8.9–10.3)
Chloride: 105 mmol/L (ref 98–111)
Creatinine, Ser: 0.62 mg/dL (ref 0.44–1.00)
GFR, Estimated: >60 mL/min (ref >60)
Glucose, Bld: 115 mg/dL — ABNORMAL HIGH (ref 70–99)
Potassium: 3.6 mmol/L (ref 3.5–5.1)
Sodium: 140 mmol/L (ref 135–145)

## 2023-06-24 LAB — D-DIMER, QUANTITATIVE: D-Dimer, Quant: 0.59 ug{FEU}/mL — ABNORMAL HIGH (ref 0.00–0.50)

## 2023-06-24 LAB — TROPONIN I (HIGH SENSITIVITY)
Troponin I (High Sensitivity): 3 ng/L (ref <18)
Troponin I (High Sensitivity): 4 ng/L (ref <18)

## 2023-06-24 MED ORDER — IOHEXOL 350 MG/ML SOLN
65.0000 mL | Freq: Once | INTRAVENOUS | Status: AC | PRN
  Administered 2023-06-24: 65 mL via INTRAVENOUS

## 2023-06-24 MED ORDER — NAPROXEN 500 MG PO TABS: 500.0000 mg | ORAL_TABLET | Freq: Two times a day (BID) | ORAL | 0 refills | Status: AC

## 2023-06-24 MED ORDER — METHOCARBAMOL 500 MG PO TABS: 500.0000 mg | ORAL_TABLET | Freq: Two times a day (BID) | ORAL | 0 refills | Status: AC

## 2023-06-24 MED ORDER — LIDOCAINE 5 % EX PTCH
1.0000 | MEDICATED_PATCH | CUTANEOUS | Status: DC
  Administered 2023-06-24: 1 via TRANSDERMAL
  Filled 2023-06-24: qty 1

## 2023-06-24 NOTE — ED Notes (Signed)
Patient transported to CT 

## 2023-06-24 NOTE — ED Provider Notes (Signed)
[place] EMERGENCY DEPARTMENT AT [facility] Provider Note   CSN: [id number] Arrival date & time: 06/24/23  1700     History  Chief Complaint  Patient presents with   Chest Pain    Brittany Romero is a 52 y.o. female.  Patient with history of gallbladder disease, GERD, high cholesterol, hypertension --presents to the emergency department today for evaluation of chest pain.  With patient's permission, family interprets.  Patient presented with left-sided chest pain that is worse with certain movements and with palpation.  Pain was sharp, worse with taking a deep breath.  She reports associated headache.  Patient was at her job when this occurred.  EMS was called and recommended that she come to the emergency department for evaluation. Patient denies risk factors for pulmonary embolism including: unilateral leg swelling, history of DVT/PE/other blood clots, use of exogenous hormones, recent immobilizations, recent surgery, recent travel (>4hr segment), malignancy, hemoptysis.          Home Medications Prior to Admission medications   Medication Sig Start Date End Date Taking? Authorizing Provider  methocarbamol (ROBAXIN) 500 MG tablet Take 1 tablet (500 mg total) by mouth 2 (two) times daily. 06/24/23  Yes Renne Crigler, PA-C  naproxen (NAPROSYN) 500 MG tablet Take 1 tablet (500 mg total) by mouth 2 (two) times daily. 06/24/23  Yes Renne Crigler, PA-C  acetaminophen (TYLENOL) 325 MG tablet Take 2 tablets (650 mg total) by mouth every 6 (six) hours as needed for mild pain or headache (or Fever >/= 101.5). Patient not taking: Reported on 02/12/2022 02/19/17   Vassie Loll, MD  ibuprofen (ADVIL) 200 MG tablet Take 200 mg by mouth every 6 (six) hours as needed for mild pain.    [provider]  lisinopril-hydrochlorothiazide (ZESTORETIC) 20-12.5 MG tablet Take 1 tablet by mouth daily. 12/22/21   [provider]      Allergies    Patient has no known allergies.     Review of Systems   Review of Systems  Physical Exam Updated Vital Signs BP (!) 142/83   Pulse 76   Temp 98.2 F (36.8 C) (Oral)   Resp 16   Ht 5\' 4"  (1.626 m)   Wt 76.2 kg   LMP 06/23/2011   SpO2 100%   BMI 28.84 kg/m   Physical Exam Vitals and nursing note reviewed.  Constitutional:      Appearance: She is well-developed. She is not diaphoretic.  HENT:     Head: Normocephalic and atraumatic.     Mouth/Throat:     Mouth: Mucous membranes are not dry.  Eyes:     Conjunctiva/sclera: Conjunctivae normal.  Neck:     Vascular: Normal carotid pulses. No JVD.     Trachea: Trachea normal. No tracheal deviation.  Cardiovascular:     Rate and Rhythm: Normal rate and regular rhythm.     Pulses: No decreased pulses.          Radial pulses are 2+ on the right side and 2+ on the left side.     Heart sounds: Normal heart sounds, S1 normal and S2 normal. No murmur heard. Pulmonary:     Effort: Pulmonary effort is normal. No respiratory distress.     Breath sounds: No wheezing.  Chest:     Chest wall: Tenderness present.     Comments: Pain is reproduced with palpation over the upper and middle left chest wall.  No ecchymosis, deformities, rashes. Abdominal:     General: Bowel sounds are  normal.     Palpations: Abdomen is soft.     Tenderness: There is no abdominal tenderness. There is no guarding or rebound.  Musculoskeletal:        General: Normal range of motion.     Cervical back: Normal range of motion and neck supple. No muscular tenderness.  Skin:    General: Skin is warm and dry.     Coloration: Skin is not pale.  Neurological:     Mental Status: She is alert.     ED Results / Procedures / Treatments   Labs (all labs ordered are listed, but only abnormal results are displayed) Labs Reviewed  BASIC METABOLIC PANEL - Abnormal; Notable for the following components:      Result Value   Glucose, Bld 115 (*)    BUN 23 (*)    All other components within normal  limits  D-DIMER, QUANTITATIVE - Abnormal; Notable for the following components:   D-Dimer, Quant 0.59 (*)    All other components within normal limits  CBC  TROPONIN I (HIGH SENSITIVITY)  TROPONIN I (HIGH SENSITIVITY)    ED ECG REPORT   Date: 06/24/2023  Rate: 80  Rhythm: normal sinus rhythm  QRS Axis: normal  Intervals: normal  ST/T Wave abnormalities: nonspecific T wave changes  Conduction Disutrbances:none  Narrative Interpretation:   Old EKG Reviewed: none available  I have personally reviewed the EKG tracing and agree with the computerized printout as noted.   Radiology CT Angio Chest PE W and/or Wo Contrast  Result Date: 06/24/2023 CLINICAL DATA:  Left-sided chest pain EXAM: CT ANGIOGRAPHY CHEST WITH CONTRAST TECHNIQUE: Multidetector CT imaging of the chest was performed using the standard protocol during bolus administration of intravenous contrast. Multiplanar CT image reconstructions and MIPs were obtained to evaluate the vascular anatomy. RADIATION DOSE REDUCTION: This exam was performed according to the departmental dose-optimization program which includes automated exposure control, adjustment of the mA and/or kV according to patient size and/or use of iterative reconstruction technique. CONTRAST:  65mL OMNIPAQUE IOHEXOL 350 MG/ML SOLN COMPARISON:  None Available. FINDINGS: Cardiovascular: No evidence of pulmonary embolus. Normal heart size. No pericardial effusion. Normal caliber thoracic aorta with no atherosclerotic disease. Mediastinum/Nodes: Small hiatal hernia. Esophagus is unremarkable. No enlarged lymph nodes seen in the chest. Lungs/Pleura: Central airways are patent. No consolidation, pleural effusion or pneumothorax. Upper Abdomen: Prior cholecystectomy.  Hepatic steatosis. Musculoskeletal: No chest wall abnormality. No acute or significant osseous findings. Review of the MIP images confirms the above findings. IMPRESSION: 1. No evidence of pulmonary embolus. 2. No  acute airspace opacity. 3. Hepatic steatosis. Electronically Signed   By: Allegra Lai M.D.   On: 06/24/2023 20:51   DG Chest 2 View  Result Date: 06/24/2023 CLINICAL DATA:  Chest pain EXAM: CHEST - 2 VIEW COMPARISON:  X-ray 02/18/2017 FINDINGS: No consolidation, pneumothorax or effusion. No edema. Normal cardiopericardial silhouette. Surgical clips are seen in the upper abdomen. Mild degenerative changes along the spine. IMPRESSION: No acute cardiopulmonary disease. Electronically Signed   By: Karen Kays M.D.   On: 06/24/2023 19:11    Procedures Procedures    Medications Ordered in ED Medications  lidocaine (LIDODERM) 5 % 1 patch (1 patch Transdermal Patch Applied 06/24/23 2128)  iohexol (OMNIPAQUE) 350 MG/ML injection 65 mL (65 mLs Intravenous Contrast Given 06/24/23 2042)    ED Course/ Medical Decision Making/ A&P    Patient seen and examined. History obtained directly from patient. Work-up including labs, imaging, EKG ordered in triage, if  performed, were reviewed.    Labs/EKG: Independently reviewed and interpreted.  This included: CBC unremarkable; BMP with minimally elevated glucose at 115 otherwise unremarkable; troponin normal at 3.  Awaiting second troponin and added D-dimer.  EKG personally reviewed and interpreted as above.  Imaging: Independently visualized and interpreted.  This included: Chest x-ray, agree negative.  Medications/Fluids: None ordered  Initial impression: Left-sided chest pain, likely chest wall pain given reproducibility.  \\  Reassessment performed. Patient appears stable.  Labs personally reviewed and interpreted including: Troponin 3 >> 4.  D-dimer was minimally elevated at 0.59.   Reviewed pertinent lab work and imaging with patient at bedside. Questions answered.   Most current vital signs reviewed and are as follows: BP (!) 142/83   Pulse 76   Temp 98.2 F (36.8 C) (Oral)   Resp 16   Ht 5\' 4"  (1.626 m)   Wt 76.2 kg   LMP 06/23/2011    SpO2 100%   BMI 28.84 kg/m   Plan: CT angio of the chest ordered.  \\  10:02 PM Reassessment performed. Patient appears comfortable, stable.  Imaging personally visualized and interpreted including: CTA, agree negative for blood clot and for pulmonary involvement or infiltrate.  Reviewed pertinent lab work and imaging with patient at bedside. Questions answered.   Most current vital signs reviewed and are as follows: BP (!) 142/83   Pulse 76   Temp 98.2 F (36.8 C) (Oral)   Resp 16   Ht 5\' 4"  (1.626 m)   Wt 76.2 kg   LMP 06/23/2011   SpO2 100%   BMI 28.84 kg/m   Plan: Discharge to home.   Prescriptions written for: Naproxen, Robaxin.  Will give Lidoderm patch prior to discharge.  Patient counseled on proper use of muscle relaxant medication.  They were told not to drink alcohol, drive any vehicle, or do any dangerous activities while taking this medication.  Patient verbalized understanding.  Other home care instructions discussed: Rest, close monitoring of symptoms.  Return and follow-up instructions: I encouraged patient to return to ED with severe chest pain, especially if the pain is crushing or pressure-like and spreads to the arms, back, neck, or jaw, or if they have associated sweating, vomiting, or shortness of breath with the pain, or significant pain with activity. We discussed that the evaluation here today indicates a low-risk of serious cause of chest pain, including heart trouble or a blood clot, but no evaluation is perfect and chest pain can evolve with time. The patient verbalized understanding and agreed.  I encouraged patient to follow-up with their provider in the next 48 hours for recheck.                                  Medical Decision Making Amount and/or Complexity of Data Reviewed Labs: ordered. Radiology: ordered.  Risk Prescription drug management.   For this patient's complaint of chest pain, the following emergent conditions were  considered on the differential diagnosis: acute coronary syndrome, pulmonary embolism, pneumothorax, myocarditis, pericardial tamponade, aortic dissection, thoracic aortic aneurysm complication, esophageal perforation.   Other causes were also considered including: gastroesophageal reflux disease, musculoskeletal pain including costochondritis, pneumonia/pleurisy, herpes zoster, pericarditis.  In regards to possibility of ACS, patient has atypical features of pain, non-ischemic and unchanged EKG and negative troponin(s). Heart score was calculated to be 2.   In regards to possibility of PE, CT angiography of the chest was done  due to mildly elevated D-dimer.  This was negative for PE.  Patient is chest tender to palpation and most likely the pain is chest wall or MSK in nature.  No rashes or signs of shingles.  The patient's vital signs, pertinent lab work and imaging were reviewed and interpreted as discussed in the ED course. Hospitalization was considered for further testing, treatments, or serial exams/observation. However as patient is well-appearing, has a stable exam, and reassuring studies today, I do not feel that they warrant admission at this time. This plan was discussed with the patient who verbalizes agreement and comfort with this plan and seems reliable and able to return to the Emergency Department with worsening or changing symptoms.          Final Clinical Impression(s) / ED Diagnoses Final diagnoses:  Chest wall pain    Rx / DC Orders ED Discharge Orders          Ordered    naproxen (NAPROSYN) 500 MG tablet  2 times daily        06/24/23 2154    methocarbamol (ROBAXIN) 500 MG tablet  2 times daily        06/24/23 2154              Renne Crigler, PA-C 06/24/23 2203    Rondel Baton, MD 06/26/23 1056

## 2023-06-24 NOTE — Discharge Instructions (Signed)
Please read and follow all provided instructions.  Your diagnoses today include:  1. Chest wall pain     Tests performed today include: An EKG of your heart A chest x-ray Cardiac enzymes - a blood test for heart muscle damage Blood counts and electrolytes CT scan of the chest: This was done because the screening test for blood clots was a little bit elevated, this did not show any significant problems or signs of clot in the lungs Vital signs. See below for your results today.   Medications prescribed:  Naproxen - anti-inflammatory pain medication Do not exceed 500mg  naproxen every 12 hours, take with food  You have been prescribed an anti-inflammatory medication or NSAID. Take with food. Take smallest effective dose for the shortest duration needed for your pain. Stop taking if you experience stomach pain or vomiting.   Robaxin (methocarbamol) - muscle relaxer medication  DO NOT drive or perform any activities that require you to be awake and alert because this medicine can make you drowsy.   Take any prescribed medications only as directed.  Follow-up instructions: Please follow-up with your primary care provider as soon as you can for further evaluation of your symptoms.   Return instructions:  SEEK IMMEDIATE MEDICAL ATTENTION IF: You have severe chest pain, especially if the pain is crushing or pressure-like and spreads to the arms, back, neck, or jaw, or if you have sweating, nausea or vomiting, or trouble with breathing. THIS IS AN EMERGENCY. Do not wait to see if the pain will go away. Get medical help at once. Call 911. DO NOT drive yourself to the hospital.  Your chest pain gets worse and does not go away after a few minutes of rest.  You have an attack of chest pain lasting longer than what you usually experience.  You have significant dizziness, if you pass out, or have trouble walking.  You have chest pain not typical of your usual pain for which you originally saw your  caregiver.  You have any other emergent concerns regarding your health.  Additional Information: Chest pain comes from many different causes. Your caregiver has diagnosed you as having chest pain that is not specific for one problem, but does not require admission.  You are at low risk for an acute heart condition or other serious illness.   Your vital signs today were: BP (!) 142/83   Pulse 76   Temp 98.2 F (36.8 C) (Oral)   Resp 16   Ht 5\' 4"  (1.626 m)   Wt 76.2 kg   LMP 06/23/2011   SpO2 100%   BMI 28.84 kg/m  If your blood pressure (BP) was elevated above 135/85 this visit, please have this repeated by your doctor within one month. --------------

## 2023-06-24 NOTE — ED Notes (Signed)
Pt called multiple times no answer 

## 2023-06-24 NOTE — ED Triage Notes (Signed)
Pt states she was at work lifting and had a pain in her left side of her chest.  Pt reports she feels a strong headache and shob.  EMS came to her work and did an EKG and told her she probably needed to come to the ED for blood work to be safe.
